# Patient Record
Sex: Male | Born: 1960 | Race: Black or African American | Hispanic: No | Marital: Single | Smoking: Current every day smoker
Health system: Southern US, Community
[De-identification: ages and names within clinical notes are randomized; demographics above are authoritative.]

## PROBLEM LIST (undated history)

## (undated) DIAGNOSIS — M1990 Unspecified osteoarthritis, unspecified site: Secondary | ICD-10-CM

## (undated) DIAGNOSIS — E119 Type 2 diabetes mellitus without complications: Secondary | ICD-10-CM

## (undated) HISTORY — PX: COLONOSCOPY: SHX174

## (undated) HISTORY — PX: FRACTURE SURGERY: SHX138

## (undated) HISTORY — PX: JOINT REPLACEMENT: SHX530

## (undated) HISTORY — PX: TONSILLECTOMY: SUR1361

## (undated) HISTORY — PX: CLOSED REDUCTION HAND FRACTURE: SHX973

---

## 2002-01-31 ENCOUNTER — Emergency Department (HOSPITAL_COMMUNITY): Admission: EM | Admit: 2002-01-31 | Discharge: 2002-01-31 | Payer: Self-pay

## 2002-01-31 ENCOUNTER — Encounter: Payer: Self-pay | Admitting: Emergency Medicine

## 2002-03-26 ENCOUNTER — Encounter: Payer: Self-pay | Admitting: Emergency Medicine

## 2002-03-26 ENCOUNTER — Emergency Department (HOSPITAL_COMMUNITY): Admission: EM | Admit: 2002-03-26 | Discharge: 2002-03-26 | Payer: Self-pay | Admitting: Emergency Medicine

## 2002-08-21 ENCOUNTER — Emergency Department (HOSPITAL_COMMUNITY): Admission: AC | Admit: 2002-08-21 | Discharge: 2002-08-21 | Payer: Self-pay | Admitting: Emergency Medicine

## 2002-08-28 ENCOUNTER — Emergency Department (HOSPITAL_COMMUNITY): Admission: EM | Admit: 2002-08-28 | Discharge: 2002-08-28 | Payer: Self-pay | Admitting: Emergency Medicine

## 2007-11-01 ENCOUNTER — Emergency Department (HOSPITAL_COMMUNITY): Admission: EM | Admit: 2007-11-01 | Discharge: 2007-11-01 | Payer: Self-pay | Admitting: Emergency Medicine

## 2008-07-18 ENCOUNTER — Emergency Department (HOSPITAL_COMMUNITY): Admission: EM | Admit: 2008-07-18 | Discharge: 2008-07-19 | Payer: Self-pay | Admitting: Emergency Medicine

## 2008-07-26 ENCOUNTER — Emergency Department (HOSPITAL_COMMUNITY): Admission: EM | Admit: 2008-07-26 | Discharge: 2008-07-26 | Payer: Self-pay | Admitting: Emergency Medicine

## 2008-08-02 ENCOUNTER — Emergency Department (HOSPITAL_COMMUNITY): Admission: EM | Admit: 2008-08-02 | Discharge: 2008-08-02 | Payer: Self-pay | Admitting: Emergency Medicine

## 2010-08-31 ENCOUNTER — Emergency Department (HOSPITAL_COMMUNITY)
Admission: EM | Admit: 2010-08-31 | Discharge: 2010-09-01 | Disposition: A | Discharge status: Other Acute Inpatient Hospital | Payer: Medicaid Other | Source: Home / Self Care | Attending: Emergency Medicine | Admitting: Emergency Medicine

## 2010-08-31 DIAGNOSIS — R1110 Vomiting, unspecified: Secondary | ICD-10-CM | POA: Insufficient documentation

## 2010-08-31 DIAGNOSIS — F1910 Other psychoactive substance abuse, uncomplicated: Secondary | ICD-10-CM | POA: Insufficient documentation

## 2010-08-31 DIAGNOSIS — F1010 Alcohol abuse, uncomplicated: Secondary | ICD-10-CM | POA: Insufficient documentation

## 2010-08-31 DIAGNOSIS — F1410 Cocaine abuse, uncomplicated: Secondary | ICD-10-CM | POA: Insufficient documentation

## 2010-08-31 DIAGNOSIS — F1210 Cannabis abuse, uncomplicated: Secondary | ICD-10-CM | POA: Insufficient documentation

## 2010-08-31 LAB — URINALYSIS, ROUTINE W REFLEX MICROSCOPIC
Bilirubin Urine: NEGATIVE
Glucose, UA: NEGATIVE mg/dL
Leukocytes, UA: NEGATIVE
Nitrite: NEGATIVE
Protein, ur: NEGATIVE mg/dL
Specific Gravity, Urine: 1.017 (ref 1.005–1.030)
Urobilinogen, UA: 1 mg/dL (ref 0.0–1.0)
pH: 5.5 (ref 5.0–8.0)

## 2010-08-31 LAB — DIFFERENTIAL
Basophils Absolute: 0 10*3/uL (ref 0.0–0.1)
Basophils Relative: 1 % (ref 0–1)
Eosinophils Absolute: 0 10*3/uL (ref 0.0–0.7)
Eosinophils Relative: 0 % (ref 0–5)
Lymphocytes Relative: 23 % (ref 12–46)
Lymphs Abs: 1.8 10*3/uL (ref 0.7–4.0)
Monocytes Absolute: 0.6 10*3/uL (ref 0.1–1.0)
Monocytes Relative: 7 % (ref 3–12)
Neutro Abs: 5.6 10*3/uL (ref 1.7–7.7)
Neutrophils Relative %: 69 % (ref 43–77)

## 2010-08-31 LAB — BASIC METABOLIC PANEL
BUN: 16 mg/dL (ref 6–23)
Chloride: 101 mEq/L (ref 96–112)
Creatinine, Ser: 1.33 mg/dL (ref 0.50–1.35)
Glucose, Bld: 87 mg/dL (ref 70–99)
Potassium: 4 mEq/L (ref 3.5–5.1)

## 2010-08-31 LAB — URINE MICROSCOPIC-ADD ON

## 2010-08-31 LAB — CBC
HCT: 38.3 % — ABNORMAL LOW (ref 39.0–52.0)
Hemoglobin: 13.3 g/dL (ref 13.0–17.0)
MCH: 29.4 pg (ref 26.0–34.0)
MCHC: 34.7 g/dL (ref 30.0–36.0)
MCV: 84.7 fL (ref 78.0–100.0)
Platelets: 243 10*3/uL (ref 150–400)
RBC: 4.52 MIL/uL (ref 4.22–5.81)
RDW: 13.4 % (ref 11.5–15.5)
WBC: 8.1 10*3/uL (ref 4.0–10.5)

## 2010-08-31 LAB — HEPATIC FUNCTION PANEL
ALT: 24 U/L (ref 0–53)
AST: 42 U/L — ABNORMAL HIGH (ref 0–37)
Bilirubin, Direct: 0.1 mg/dL (ref 0.0–0.3)
Indirect Bilirubin: 0.1 mg/dL — ABNORMAL LOW (ref 0.3–0.9)
Total Bilirubin: 0.2 mg/dL — ABNORMAL LOW (ref 0.3–1.2)

## 2010-08-31 LAB — RAPID URINE DRUG SCREEN, HOSP PERFORMED
Amphetamines: NOT DETECTED
Barbiturates: NOT DETECTED
Benzodiazepines: NOT DETECTED
Cocaine: POSITIVE — AB
Opiates: NOT DETECTED
Tetrahydrocannabinol: NOT DETECTED

## 2010-09-01 ENCOUNTER — Inpatient Hospital Stay (HOSPITAL_COMMUNITY)
Admission: AD | Admit: 2010-09-01 | Discharge: 2010-09-05 | DRG: 897 | Disposition: A | Discharge status: Home / Self Care | Payer: Medicaid Other | Source: Ambulatory Visit | Attending: Psychiatry | Admitting: Psychiatry

## 2010-09-01 DIAGNOSIS — R1110 Vomiting, unspecified: Secondary | ICD-10-CM

## 2010-09-01 DIAGNOSIS — Z560 Unemployment, unspecified: Secondary | ICD-10-CM

## 2010-09-01 DIAGNOSIS — K219 Gastro-esophageal reflux disease without esophagitis: Secondary | ICD-10-CM

## 2010-09-01 DIAGNOSIS — F1020 Alcohol dependence, uncomplicated: Principal | ICD-10-CM

## 2010-09-01 DIAGNOSIS — F1920 Other psychoactive substance dependence, uncomplicated: Secondary | ICD-10-CM

## 2010-09-01 DIAGNOSIS — F1910 Other psychoactive substance abuse, uncomplicated: Secondary | ICD-10-CM

## 2010-09-01 DIAGNOSIS — F1420 Cocaine dependence, uncomplicated: Secondary | ICD-10-CM

## 2010-09-01 DIAGNOSIS — F1220 Cannabis dependence, uncomplicated: Secondary | ICD-10-CM

## 2010-09-04 DIAGNOSIS — F1920 Other psychoactive substance dependence, uncomplicated: Secondary | ICD-10-CM

## 2010-09-07 NOTE — Discharge Summary (Signed)
[name], [name] NO.:  [id number]  MEDICAL RECORD NO.:  [id number]  LOCATION:  0305                          FACILITY:  [facility]  PHYSICIAN:  Franchot Gallo, MD     DATE OF BIRTH:  [birth year]  DATE OF ADMISSION:  09/01/2010 DATE OF DISCHARGE:  09/05/2010                              DISCHARGE SUMMARY   Mr. Keith Vaughn is a 50 year old single black male who was admitted to [facility] on September 01, 2010, for detox from alcohol and cocaine. The patient stated that he had been abusing cocaine, alcohol and marijuana for at least the past 27 years.  The patient was admitted and initially seen by Dr. Debbora Lacrosse who is no longer with Behavioral [facility].  The patient was next seen by the on-call team on September 02, 2010, and the patient was found in bed and very sleepy.  His case manager indicated that he was interested in [facility].  No other information was given.  The patient was next seen by the on-call treatment team on September 03, 2010, and stated that he was feeling okay and was up and going to groups.  He denied any medication side effects and voiced no new complaints.  He denied suicidal or homicidal ideations.  He was continued on his current treatment plan.  The patient was next seen by the on-call treatment team on September 04, 2010, and stated that he was eating well and sleeping well.  He denied any suicidal or homicidal ideations.  The patient was next seen by this provider for the first time on September 05, 2010, after the patient was transferred to me when Dr. Judithann Sheen left Behavioral Health.  The patient reported that he was sleeping well without difficulty and reported a good appetite and denied any depression.  He stated on a scale of 1-10 his depressive symptoms were zero.  He adamantly denied any suicidal or homicidal ideations and also denied any auditory or visual hallucinations or delusional thinking.  He denied any anxiety symptoms and stated that he felt ready  for discharge. The patient also denied any symptoms of substance withdrawal.  LABORATORY STUDIES:  On August 31, 2010, the patient's SGOT was slightly elevated at 42.  The patient's urine drug screen was positive for cocaine.  There were no other significant laboratories.  DISCHARGE MEDICATIONS: 1. Multivitamin p.o. q.a.m. 2. Protonix 40 mg p.o. b.i.d. with meals.  DISCHARGE DIAGNOSES:  Axis I:  Polysubstance dependence:  alcohol, cocaine and marijuana, recent usage. Axis II:  Deferred. Axis III:  Gastroesophageal reflux disease. Axis IV:  Ongoing substance-abuse-related issues.  Limited primary support system.  Unemployment.  Financial constraints. Axis V:  Global Assessment of Functioning at time of admission approximately 34.  Global Assessment of Functioning at time of discharge approximately 70.  Highest Global Assessment of Functioning in previous year approximately 70.  FOLLOW-UP INSTRUCTIONS:  The patient is to follow up with [facility] in [place], [place], and will begin groups each morning on September 06, 2010.  The patient will also follow up with [facility] residential treatment on September 12, 2010, at 8 a.m. for an intake.    ___________________________________ Harvie Heck  Jann Milkovich, MD     RR/MEDQ  D:  09/06/2010  T:  09/07/2010  Job:  [id number]  Electronically Signed by Franchot Gallo MD on 09/07/2010 07:29:55 AM

## 2010-09-13 NOTE — H&P (Signed)
NAMESAI, Keith NO.:  [id number]  MEDICAL RECORD NO.:  [id number]  LOCATION:                                 FACILITY:  PHYSICIAN:  Franchot Gallo, MD     DATE OF BIRTH:  [birth year]  DATE OF ADMISSION: DATE OF DISCHARGE:                      PSYCHIATRIC ADMISSION ASSESSMENT   (please note that the dictated psychiatric evaluation is based on data obtained from the chart.  The patient was admitted by Dr. Debbora Lacrosse and was only after discharge was it discovered that an initial psychiatric evaluation had never been dictated.  This physician is attempting to create a psychiatric evaluation from information and data in the chart.)  CHIEF COMPLAINT:  "I need detox from cocaine and alcohol."  HISTORY OF PRESENT ILLNESS:  Mr. Crum is a 50 year old single black male who presents to [facility] requesting detox from cocaine and alcohol which he states he began using approximately 30 years ago.  The patient denied any suicidal or homicidal ideations nor did report any auditory or visual hallucinations or delusional thinking.  He also denied any prolonged manic or hypomanic symptoms.  On review of the patient's record, it appears that he first began using cocaine at the age of 51 and uses approximately  2-3 times per week.  He also states that he used marijuana starting at age 54 and his usage varies.  He also reports to drinking alcohol starting at the age of 8 and averages a fifth daily.  All of his last use of this was the day prior to discharge.  The patient was admitted for detoxification from alcohol and cocaine.  PAST PSYCHIATRIC HISTORY:  From review of the patient's records, it appears that he has been hospitalized at numerous treatment centers for substance abuse-related issues.  CURRENT MEDICATIONS:  multivitamin p.o. q.a.m.  ALLERGIES:  NKDA.  MEDICAL ILLNESSES:  GU reflux disease.  PAST OPERATIONS:  None reported.  FAMILY HISTORY:  The  patient appears to have not reported any family history of psychiatric or substance abuse-related illnesses.  SOCIAL HISTORY:  The patient is single and has one son.  He is currently living with his mother and has support from his 3 sisters.  The patient is unemployed.  He completed the 11th grade of school.  As stated above, the patient reports to abusing alcohol, cocaine and marijuana since the age of 23 years.  MENTAL STATUS EXAM:  Mental status exam at time of admission must be deferred since this provider was not present at time of admission.  DIAGNOSIS:   AXIS I:  Polysubstance dependence, ongoing usage. AXIS II:  Deferred. AXIS III:  GU reflux disease. AXIS IV:  Ongoing use of substances.  Financial constraints. AXIS V:  GAF at time of admission approximately 34.  Highest GAF in past year approximately 70.  PLAN: 1. The patient was admitted to [facility] for treatment of his     substance abuse-related illnesses. 2. The patient was started on Protonix at 40 mg p.o. b.i.d. for GE     reflux disease. 3. The patient will be monitored for dangerousness to self and/or     others as well as for substance  withdrawal. 4. The patient will participate in unit in group activities per unit     routine.    ___________________________________ Franchot Gallo, MD     RR/MEDQ  D:  09/06/2010  T:  09/07/2010  Job:  [id number]  Electronically Signed by Franchot Gallo MD on 09/13/2010 03:14:20 PM

## 2010-11-03 ENCOUNTER — Emergency Department (HOSPITAL_COMMUNITY)
Admission: EM | Admit: 2010-11-03 | Discharge: 2010-11-03 | Disposition: A | Discharge status: Home / Self Care | Payer: Medicaid Other | Attending: Emergency Medicine | Admitting: Emergency Medicine

## 2010-11-03 DIAGNOSIS — F1910 Other psychoactive substance abuse, uncomplicated: Secondary | ICD-10-CM | POA: Insufficient documentation

## 2010-11-03 DIAGNOSIS — F1010 Alcohol abuse, uncomplicated: Secondary | ICD-10-CM | POA: Insufficient documentation

## 2010-11-03 LAB — RAPID URINE DRUG SCREEN, HOSP PERFORMED
Amphetamines: NOT DETECTED
Barbiturates: NOT DETECTED
Benzodiazepines: NOT DETECTED
Cocaine: POSITIVE — AB

## 2010-11-03 LAB — DIFFERENTIAL
Basophils Absolute: 0 10*3/uL (ref 0.0–0.1)
Basophils Relative: 1 % (ref 0–1)
Eosinophils Absolute: 0.2 10*3/uL (ref 0.0–0.7)
Eosinophils Relative: 4 % (ref 0–5)
Monocytes Absolute: 0.8 10*3/uL (ref 0.1–1.0)
Monocytes Relative: 13 % — ABNORMAL HIGH (ref 3–12)

## 2010-11-03 LAB — URINALYSIS, ROUTINE W REFLEX MICROSCOPIC
Bilirubin Urine: NEGATIVE
Ketones, ur: NEGATIVE mg/dL
Nitrite: NEGATIVE
pH: 6.5 (ref 5.0–8.0)

## 2010-11-03 LAB — COMPREHENSIVE METABOLIC PANEL
ALT: 19 U/L (ref 0–53)
AST: 33 U/L (ref 0–37)
Albumin: 4 g/dL (ref 3.5–5.2)
Calcium: 9.4 mg/dL (ref 8.4–10.5)
GFR calc Af Amer: >60 mL/min (ref >60)
Glucose, Bld: 102 mg/dL — ABNORMAL HIGH (ref 70–99)
Sodium: 137 mEq/L (ref 135–145)
Total Protein: 7 g/dL (ref 6.0–8.3)

## 2010-11-03 LAB — CBC
MCH: 28.7 pg (ref 26.0–34.0)
MCHC: 33.6 g/dL (ref 30.0–36.0)
Platelets: 251 10*3/uL (ref 150–400)
RDW: 13.6 % (ref 11.5–15.5)

## 2015-10-07 ENCOUNTER — Encounter (HOSPITAL_COMMUNITY): Payer: Self-pay

## 2015-11-01 ENCOUNTER — Other Ambulatory Visit: Payer: Self-pay | Admitting: Physician Assistant

## 2015-11-04 ENCOUNTER — Other Ambulatory Visit (HOSPITAL_COMMUNITY): Payer: Self-pay | Admitting: *Deleted

## 2015-11-04 NOTE — Pre-Procedure Instructions (Signed)
Thomes CakeDewey H Burpee  11/04/2015      [facility] - [place], [place] - [address] [place] [id number] Phone: [phone] Fax: [phone]    Your procedure is scheduled on 11-16-2015  Tuesday .  [facility] at 11:00 A.M.   Call this number if you have problems the morning of surgery:  [phone]   Remember:  Do not eat food or drink liquids after midnight.   Take these medicines the morning of surgery with A SIP OF WATER none          STOP ASPIRIN,ANTIINFLAMATORIES (IBUPROFEN,ALEVE,MOTRIN,ADVIL,GOODY'S POWDERS),HERBAL SUPPLEMENTS,FISH OIL,AND VITAMINS 5-7 DAYS PRIOR TO SURGERY                How to Manage Your Diabetes Before and After Surgery  Why is it important to control my blood sugar before and after surgery? . Improving blood sugar levels before and after surgery helps healing and can limit problems. . A way of improving blood sugar control is eating a healthy diet by: o  Eating less sugar and carbohydrates o  Increasing activity/exercise o  Talking with your doctor about reaching your blood sugar goals . High blood sugars (greater than 180 mg/dL) can raise your risk of infections and slow your recovery, so you will need to focus on controlling your diabetes during the weeks before surgery. . Make sure that the doctor who takes care of your diabetes knows about your planned surgery including the date and location.  How do I manage my blood sugar before surgery? . Check your blood sugar at least 4 times a day, starting 2 days before surgery, to make sure that the level is not too high or low. o Check your blood sugar the morning of your surgery when you wake up and every 2 hours until you get to the Short Stay unit. . If your blood sugar is less than 70 mg/dL, you will need to treat for low blood sugar: o Do not take insulin. o Treat a low blood sugar (less than 70 mg/dL) with  cup  of clear juice (cranberry or apple), 4 glucose tablets, OR glucose gel. o Recheck blood sugar in 15 minutes after treatment (to make sure it is greater than 70 mg/dL). If your blood sugar is not greater than 70 mg/dL on recheck, call [phone] for further instructions. . Report your blood sugar to the short stay nurse when you get to Short Stay.  . If you are admitted to the hospital after surgery: o Your blood sugar will be checked by the staff and you will probably be given insulin after surgery (instead of oral diabetes medicines) to make sure you have good blood sugar levels. o The goal for blood sugar control after surgery is 80-180 mg/dL.              WHAT DO I DO ABOUT MY DIABETES MEDICATION?   Marland Kitchen. Do not take oral diabetes medicines (pills) the morning of surgery.     . The day of surgery, do not take other diabetes injectables, including Byetta (exenatide), Bydureon (exenatide ER), Victoza (liraglutide), or Trulicity (dulaglutide).    Reviewed and Endorsed by [facility] Patient Education Committee, August 2015   Do not wear jewelry.  Do not wear lotions, powders, or perfumes, or deoderant.  Do not shave 48 hours prior to surgery.  Men may shave face and neck.   Do not bring valuables to  the hospital.  [facility] is not responsible for any belongings or valuables.  Contacts, dentures or bridgework may not be worn into surgery.  Leave your suitcase in the car.  After surgery it may be brought to your room.  For patients admitted to the hospital, discharge time will be determined by your treatment team.  Patients discharged the day of surgery will not be allowed to drive home.    Special instructions:  See attached Sheet for instructions on CHG showers  Please read over the following fact sheets that you were given. MRSA Information and Surgical Site Infection Prevention

## 2015-11-05 ENCOUNTER — Encounter (HOSPITAL_COMMUNITY)
Admission: RE | Admit: 2015-11-05 | Discharge: 2015-11-05 | Disposition: A | Discharge status: Home / Self Care | Payer: Medicaid Other | Source: Ambulatory Visit | Attending: Orthopaedic Surgery | Admitting: Orthopaedic Surgery

## 2015-11-05 ENCOUNTER — Encounter (HOSPITAL_COMMUNITY): Payer: Self-pay

## 2015-11-05 DIAGNOSIS — M1612 Unilateral primary osteoarthritis, left hip: Secondary | ICD-10-CM | POA: Insufficient documentation

## 2015-11-05 DIAGNOSIS — Z01818 Encounter for other preprocedural examination: Secondary | ICD-10-CM | POA: Diagnosis present

## 2015-11-05 DIAGNOSIS — R001 Bradycardia, unspecified: Secondary | ICD-10-CM | POA: Diagnosis not present

## 2015-11-05 DIAGNOSIS — Z01812 Encounter for preprocedural laboratory examination: Secondary | ICD-10-CM | POA: Insufficient documentation

## 2015-11-05 HISTORY — DX: Unspecified osteoarthritis, unspecified site: M19.90

## 2015-11-05 LAB — BASIC METABOLIC PANEL
ANION GAP: 5 (ref 5–15)
BUN: 12 mg/dL (ref 6–20)
CALCIUM: 9.8 mg/dL (ref 8.9–10.3)
CO2: 28 mmol/L (ref 22–32)
CREATININE: 1.22 mg/dL (ref 0.61–1.24)
Chloride: 107 mmol/L (ref 101–111)
GFR calc Af Amer: >60 mL/min (ref >60)
GLUCOSE: 71 mg/dL (ref 65–99)
Potassium: 5 mmol/L (ref 3.5–5.1)
Sodium: 140 mmol/L (ref 135–145)

## 2015-11-05 LAB — CBC
HCT: 38.7 % — ABNORMAL LOW (ref 39.0–52.0)
HEMOGLOBIN: 12.4 g/dL — AB (ref 13.0–17.0)
MCH: 27.5 pg (ref 26.0–34.0)
MCHC: 32 g/dL (ref 30.0–36.0)
MCV: 85.8 fL (ref 78.0–100.0)
PLATELETS: 221 10*3/uL (ref 150–400)
RBC: 4.51 MIL/uL (ref 4.22–5.81)
RDW: 13.3 % (ref 11.5–15.5)
WBC: 7.1 10*3/uL (ref 4.0–10.5)

## 2015-11-05 LAB — SURGICAL PCR SCREEN
MRSA, PCR: NEGATIVE
Staphylococcus aureus: NEGATIVE

## 2015-11-05 LAB — GLUCOSE, CAPILLARY: GLUCOSE-CAPILLARY: 109 mg/dL — AB (ref 65–99)

## 2015-11-06 LAB — HEMOGLOBIN A1C
HEMOGLOBIN A1C: 5.7 % — AB (ref 4.8–5.6)
MEAN PLASMA GLUCOSE: 117 mg/dL

## 2015-11-15 MED ORDER — CEFAZOLIN SODIUM-DEXTROSE 2-4 GM/100ML-% IV SOLN
2.0000 g | INTRAVENOUS | Status: AC
  Administered 2015-11-16: 2 g via INTRAVENOUS
  Filled 2015-11-15: qty 100

## 2015-11-15 MED ORDER — TRANEXAMIC ACID 1000 MG/10ML IV SOLN
1000.0000 mg | INTRAVENOUS | Status: AC
  Administered 2015-11-16: 1000 mg via INTRAVENOUS
  Filled 2015-11-15: qty 10

## 2015-11-16 ENCOUNTER — Inpatient Hospital Stay (HOSPITAL_COMMUNITY): Payer: Medicaid Other | Admitting: Certified Registered"

## 2015-11-16 ENCOUNTER — Encounter (HOSPITAL_COMMUNITY)
Admission: RE | Disposition: A | Discharge status: Home Health | Payer: Self-pay | Source: Ambulatory Visit | Attending: Orthopaedic Surgery

## 2015-11-16 ENCOUNTER — Encounter (HOSPITAL_COMMUNITY): Payer: Self-pay | Admitting: Surgery

## 2015-11-16 ENCOUNTER — Inpatient Hospital Stay (HOSPITAL_COMMUNITY): Payer: Medicaid Other

## 2015-11-16 ENCOUNTER — Inpatient Hospital Stay (HOSPITAL_COMMUNITY)
Admission: RE | Admit: 2015-11-16 | Discharge: 2015-11-19 | DRG: 470 | Disposition: A | Discharge status: Home Health | Payer: Medicaid Other | Source: Ambulatory Visit | Attending: Orthopaedic Surgery | Admitting: Orthopaedic Surgery

## 2015-11-16 DIAGNOSIS — E119 Type 2 diabetes mellitus without complications: Secondary | ICD-10-CM | POA: Diagnosis present

## 2015-11-16 DIAGNOSIS — Z791 Long term (current) use of non-steroidal anti-inflammatories (NSAID): Secondary | ICD-10-CM | POA: Diagnosis not present

## 2015-11-16 DIAGNOSIS — Z96642 Presence of left artificial hip joint: Secondary | ICD-10-CM

## 2015-11-16 DIAGNOSIS — M1612 Unilateral primary osteoarthritis, left hip: Principal | ICD-10-CM | POA: Diagnosis present

## 2015-11-16 DIAGNOSIS — Z794 Long term (current) use of insulin: Secondary | ICD-10-CM

## 2015-11-16 DIAGNOSIS — M25552 Pain in left hip: Secondary | ICD-10-CM | POA: Diagnosis present

## 2015-11-16 DIAGNOSIS — F17210 Nicotine dependence, cigarettes, uncomplicated: Secondary | ICD-10-CM | POA: Diagnosis present

## 2015-11-16 DIAGNOSIS — Z419 Encounter for procedure for purposes other than remedying health state, unspecified: Secondary | ICD-10-CM

## 2015-11-16 HISTORY — PX: TOTAL HIP ARTHROPLASTY: SHX124

## 2015-11-16 LAB — GLUCOSE, CAPILLARY
GLUCOSE-CAPILLARY: 86 mg/dL (ref 65–99)
GLUCOSE-CAPILLARY: 106 mg/dL — AB (ref 65–99)
GLUCOSE-CAPILLARY: 86 mg/dL (ref 65–99)
Glucose-Capillary: 106 mg/dL — ABNORMAL HIGH (ref 65–99)

## 2015-11-16 SURGERY — ARTHROPLASTY, HIP, TOTAL, ANTERIOR APPROACH
Anesthesia: General | Site: Hip | Laterality: Left

## 2015-11-16 MED ORDER — ONDANSETRON HCL 4 MG/2ML IJ SOLN
INTRAMUSCULAR | Status: DC | PRN
  Administered 2015-11-16: 4 mg via INTRAVENOUS

## 2015-11-16 MED ORDER — CEFAZOLIN IN D5W 1 GM/50ML IV SOLN
1.0000 g | Freq: Four times a day (QID) | INTRAVENOUS | Status: AC
  Administered 2015-11-16 – 2015-11-17 (×2): 1 g via INTRAVENOUS
  Filled 2015-11-16 (×2): qty 50

## 2015-11-16 MED ORDER — FENTANYL CITRATE (PF) 100 MCG/2ML IJ SOLN
INTRAMUSCULAR | Status: AC
  Filled 2015-11-16: qty 2

## 2015-11-16 MED ORDER — ROCURONIUM 10MG/ML (10ML) SYRINGE FOR MEDFUSION PUMP - OPTIME
INTRAVENOUS | Status: DC | PRN
  Administered 2015-11-16: 20 mg via INTRAVENOUS
  Administered 2015-11-16: 40 mg via INTRAVENOUS

## 2015-11-16 MED ORDER — MIDAZOLAM HCL 2 MG/2ML IJ SOLN
INTRAMUSCULAR | Status: AC
  Filled 2015-11-16: qty 2

## 2015-11-16 MED ORDER — HYDROMORPHONE HCL 1 MG/ML IJ SOLN
1.0000 mg | INTRAMUSCULAR | Status: DC | PRN
  Administered 2015-11-17 – 2015-11-18 (×11): 1 mg via INTRAVENOUS
  Filled 2015-11-16 (×15): qty 1

## 2015-11-16 MED ORDER — INSULIN ASPART 100 UNIT/ML ~~LOC~~ SOLN: 0.0000 [IU] | Freq: Three times a day (TID) | SUBCUTANEOUS | Status: DC

## 2015-11-16 MED ORDER — METFORMIN HCL 500 MG PO TABS
500.0000 mg | ORAL_TABLET | Freq: Two times a day (BID) | ORAL | Status: DC
Start: 2015-11-17 — End: 2015-11-19
  Administered 2015-11-17 – 2015-11-19 (×5): 500 mg via ORAL
  Filled 2015-11-16 (×5): qty 1

## 2015-11-16 MED ORDER — ACETAMINOPHEN 10 MG/ML IV SOLN
INTRAVENOUS | Status: DC | PRN
  Administered 2015-11-16: 1000 mg via INTRAVENOUS

## 2015-11-16 MED ORDER — KETOROLAC TROMETHAMINE 15 MG/ML IJ SOLN
INTRAMUSCULAR | Status: AC
  Filled 2015-11-16: qty 1

## 2015-11-16 MED ORDER — CHLORHEXIDINE GLUCONATE 4 % EX LIQD: 60.0000 mL | Freq: Once | CUTANEOUS | Status: DC

## 2015-11-16 MED ORDER — HYDROMORPHONE HCL 1 MG/ML IJ SOLN
INTRAMUSCULAR | Status: DC | PRN
  Administered 2015-11-16: 1 mg via INTRAVENOUS

## 2015-11-16 MED ORDER — ONDANSETRON HCL 4 MG/2ML IJ SOLN: 4.0000 mg | Freq: Four times a day (QID) | INTRAMUSCULAR | Status: DC | PRN

## 2015-11-16 MED ORDER — LIDOCAINE 2% (20 MG/ML) 5 ML SYRINGE
INTRAMUSCULAR | Status: AC
  Filled 2015-11-16: qty 5

## 2015-11-16 MED ORDER — DEXTROSE 5 % IV SOLN
500.0000 mg | Freq: Four times a day (QID) | INTRAVENOUS | Status: DC | PRN
  Filled 2015-11-16: qty 5

## 2015-11-16 MED ORDER — PROPOFOL 10 MG/ML IV BOLUS
INTRAVENOUS | Status: AC
  Filled 2015-11-16: qty 20

## 2015-11-16 MED ORDER — GLYCOPYRROLATE 0.2 MG/ML IV SOSY
PREFILLED_SYRINGE | INTRAVENOUS | Status: AC
  Filled 2015-11-16: qty 3

## 2015-11-16 MED ORDER — PHENYLEPHRINE 40 MCG/ML (10ML) SYRINGE FOR IV PUSH (FOR BLOOD PRESSURE SUPPORT)
PREFILLED_SYRINGE | INTRAVENOUS | Status: AC
  Filled 2015-11-16: qty 10

## 2015-11-16 MED ORDER — ZOLPIDEM TARTRATE 5 MG PO TABS
5.0000 mg | ORAL_TABLET | Freq: Every evening | ORAL | Status: DC | PRN
Start: 2015-11-16 — End: 2015-11-19
  Administered 2015-11-16 – 2015-11-18 (×3): 5 mg via ORAL
  Filled 2015-11-16 (×3): qty 1

## 2015-11-16 MED ORDER — GLYCOPYRROLATE 0.2 MG/ML IJ SOLN
INTRAMUSCULAR | Status: DC | PRN
  Administered 2015-11-16: 0.6 mg via INTRAVENOUS

## 2015-11-16 MED ORDER — PROMETHAZINE HCL 25 MG/ML IJ SOLN: 6.2500 mg | INTRAMUSCULAR | Status: DC | PRN

## 2015-11-16 MED ORDER — SODIUM CHLORIDE 0.9 % IR SOLN
Status: DC | PRN
  Administered 2015-11-16: 3000 mL

## 2015-11-16 MED ORDER — METOCLOPRAMIDE HCL 5 MG PO TABS: 5.0000 mg | ORAL_TABLET | Freq: Three times a day (TID) | ORAL | Status: DC | PRN

## 2015-11-16 MED ORDER — KETOROLAC TROMETHAMINE 15 MG/ML IJ SOLN
7.5000 mg | Freq: Four times a day (QID) | INTRAMUSCULAR | Status: AC
  Administered 2015-11-16 – 2015-11-17 (×4): 7.5 mg via INTRAVENOUS
  Filled 2015-11-16 (×3): qty 1

## 2015-11-16 MED ORDER — LACTATED RINGERS IV SOLN
INTRAVENOUS | Status: DC
  Administered 2015-11-16 (×3): via INTRAVENOUS

## 2015-11-16 MED ORDER — PHENYLEPHRINE HCL 10 MG/ML IJ SOLN
INTRAMUSCULAR | Status: DC | PRN
  Administered 2015-11-16: 80 ug via INTRAVENOUS

## 2015-11-16 MED ORDER — NALOXONE HCL 0.4 MG/ML IJ SOLN
INTRAMUSCULAR | Status: AC
  Filled 2015-11-16: qty 1

## 2015-11-16 MED ORDER — SODIUM CHLORIDE 0.9 % IV SOLN: INTRAVENOUS | Status: DC

## 2015-11-16 MED ORDER — MENTHOL 3 MG MT LOZG: 1.0000 | LOZENGE | OROMUCOSAL | Status: DC | PRN

## 2015-11-16 MED ORDER — HYDROMORPHONE HCL 1 MG/ML IJ SOLN
0.2500 mg | INTRAMUSCULAR | Status: DC | PRN
  Administered 2015-11-16: 0.5 mg via INTRAVENOUS

## 2015-11-16 MED ORDER — NEOSTIGMINE METHYLSULFATE 5 MG/5ML IV SOSY
PREFILLED_SYRINGE | INTRAVENOUS | Status: AC
  Filled 2015-11-16: qty 5

## 2015-11-16 MED ORDER — MIDAZOLAM HCL 5 MG/5ML IJ SOLN
INTRAMUSCULAR | Status: DC | PRN
  Administered 2015-11-16: 2 mg via INTRAVENOUS

## 2015-11-16 MED ORDER — ONDANSETRON HCL 4 MG PO TABS: 4.0000 mg | ORAL_TABLET | Freq: Four times a day (QID) | ORAL | Status: DC | PRN

## 2015-11-16 MED ORDER — ACETAMINOPHEN 325 MG PO TABS
650.0000 mg | ORAL_TABLET | Freq: Four times a day (QID) | ORAL | Status: DC | PRN
  Administered 2015-11-17 – 2015-11-19 (×5): 650 mg via ORAL
  Filled 2015-11-16 (×5): qty 2

## 2015-11-16 MED ORDER — METHOCARBAMOL 500 MG PO TABS
500.0000 mg | ORAL_TABLET | Freq: Four times a day (QID) | ORAL | Status: DC | PRN
  Administered 2015-11-16 – 2015-11-19 (×8): 500 mg via ORAL
  Filled 2015-11-16 (×9): qty 1

## 2015-11-16 MED ORDER — PROPOFOL 10 MG/ML IV BOLUS
INTRAVENOUS | Status: DC | PRN
  Administered 2015-11-16: 200 mg via INTRAVENOUS

## 2015-11-16 MED ORDER — ACETAMINOPHEN 650 MG RE SUPP: 650.0000 mg | Freq: Four times a day (QID) | RECTAL | Status: DC | PRN

## 2015-11-16 MED ORDER — DEXMEDETOMIDINE HCL 200 MCG/2ML IV SOLN
INTRAVENOUS | Status: DC | PRN
  Administered 2015-11-16 (×2): 20 ug via INTRAVENOUS

## 2015-11-16 MED ORDER — FENTANYL CITRATE (PF) 100 MCG/2ML IJ SOLN
INTRAMUSCULAR | Status: DC | PRN
  Administered 2015-11-16 (×5): 100 ug via INTRAVENOUS

## 2015-11-16 MED ORDER — OXYCODONE HCL 5 MG PO TABS
5.0000 mg | ORAL_TABLET | ORAL | Status: DC | PRN
  Administered 2015-11-16 – 2015-11-19 (×17): 10 mg via ORAL
  Filled 2015-11-16 (×2): qty 2
  Filled 2015-11-16: qty 1
  Filled 2015-11-16 (×9): qty 2
  Filled 2015-11-16: qty 1
  Filled 2015-11-16 (×6): qty 2

## 2015-11-16 MED ORDER — 0.9 % SODIUM CHLORIDE (POUR BTL) OPTIME
TOPICAL | Status: DC | PRN
  Administered 2015-11-16: 1000 mL

## 2015-11-16 MED ORDER — DIPHENHYDRAMINE HCL 12.5 MG/5ML PO ELIX: 12.5000 mg | ORAL_SOLUTION | ORAL | Status: DC | PRN

## 2015-11-16 MED ORDER — NEOSTIGMINE METHYLSULFATE 10 MG/10ML IV SOLN
INTRAVENOUS | Status: DC | PRN
  Administered 2015-11-16: 3 mg via INTRAVENOUS

## 2015-11-16 MED ORDER — METOCLOPRAMIDE HCL 5 MG/ML IJ SOLN: 5.0000 mg | Freq: Three times a day (TID) | INTRAMUSCULAR | Status: DC | PRN

## 2015-11-16 MED ORDER — ROCURONIUM BROMIDE 10 MG/ML (PF) SYRINGE
PREFILLED_SYRINGE | INTRAVENOUS | Status: AC
  Filled 2015-11-16: qty 10

## 2015-11-16 MED ORDER — ONDANSETRON HCL 4 MG/2ML IJ SOLN
INTRAMUSCULAR | Status: AC
  Filled 2015-11-16: qty 2

## 2015-11-16 MED ORDER — ACETAMINOPHEN 10 MG/ML IV SOLN
INTRAVENOUS | Status: AC
  Filled 2015-11-16: qty 100

## 2015-11-16 MED ORDER — HYDROMORPHONE HCL 1 MG/ML IJ SOLN
INTRAMUSCULAR | Status: AC
  Filled 2015-11-16: qty 1

## 2015-11-16 MED ORDER — DOCUSATE SODIUM 100 MG PO CAPS
100.0000 mg | ORAL_CAPSULE | Freq: Two times a day (BID) | ORAL | Status: DC
  Administered 2015-11-16 – 2015-11-18 (×4): 100 mg via ORAL
  Filled 2015-11-16 (×5): qty 1

## 2015-11-16 MED ORDER — ALUM & MAG HYDROXIDE-SIMETH 200-200-20 MG/5ML PO SUSP: 30.0000 mL | ORAL | Status: DC | PRN

## 2015-11-16 MED ORDER — LIDOCAINE HCL (CARDIAC) 20 MG/ML IV SOLN
INTRAVENOUS | Status: DC | PRN
  Administered 2015-11-16: 100 mg via INTRAVENOUS

## 2015-11-16 MED ORDER — HYDROMORPHONE HCL 1 MG/ML IJ SOLN
INTRAMUSCULAR | Status: AC
  Administered 2015-11-16: 0.5 mg via INTRAVENOUS
  Filled 2015-11-16: qty 1

## 2015-11-16 MED ORDER — ASPIRIN EC 325 MG PO TBEC
325.0000 mg | DELAYED_RELEASE_TABLET | Freq: Two times a day (BID) | ORAL | Status: DC
  Administered 2015-11-16 – 2015-11-19 (×6): 325 mg via ORAL
  Filled 2015-11-16 (×6): qty 1

## 2015-11-16 MED ORDER — PHENOL 1.4 % MT LIQD: 1.0000 | OROMUCOSAL | Status: DC | PRN

## 2015-11-16 SURGICAL SUPPLY — 50 items
APL SKNCLS STERI-STRIP NONHPOA (GAUZE/BANDAGES/DRESSINGS) ×1
BENZOIN TINCTURE PRP APPL 2/3 (GAUZE/BANDAGES/DRESSINGS) ×2 IMPLANT
BLADE SAW SGTL 18X1.27X75 (BLADE) ×2 IMPLANT
BLADE SURG ROTATE 9660 (MISCELLANEOUS) IMPLANT
CAPT HIP TOTAL 2 ×1 IMPLANT
CELLS DAT CNTRL 66122 CELL SVR (MISCELLANEOUS) ×1 IMPLANT
COVER SURGICAL LIGHT HANDLE (MISCELLANEOUS) ×2 IMPLANT
DRAPE C-ARM 42X72 X-RAY (DRAPES) ×2 IMPLANT
DRAPE STERI IOBAN 125X83 (DRAPES) ×2 IMPLANT
DRAPE U-SHAPE 47X51 STRL (DRAPES) ×6 IMPLANT
DRSG AQUACEL AG ADV 3.5X10 (GAUZE/BANDAGES/DRESSINGS) ×2 IMPLANT
DURAPREP 26ML APPLICATOR (WOUND CARE) ×2 IMPLANT
ELECT BLADE 4.0 EZ CLEAN MEGAD (MISCELLANEOUS) ×2
ELECT BLADE 6.5 EXT (BLADE) IMPLANT
ELECT REM PT RETURN 9FT ADLT (ELECTROSURGICAL) ×2
ELECTRODE BLDE 4.0 EZ CLN MEGD (MISCELLANEOUS) ×1 IMPLANT
ELECTRODE REM PT RTRN 9FT ADLT (ELECTROSURGICAL) ×1 IMPLANT
FACESHIELD WRAPAROUND (MASK) ×4 IMPLANT
FACESHIELD WRAPAROUND OR TEAM (MASK) ×2 IMPLANT
GLOVE BIOGEL PI IND STRL 8 (GLOVE) ×2 IMPLANT
GLOVE BIOGEL PI INDICATOR 8 (GLOVE) ×2
GLOVE ECLIPSE 8.0 STRL XLNG CF (GLOVE) ×2 IMPLANT
GLOVE ORTHO TXT STRL SZ7.5 (GLOVE) ×4 IMPLANT
GOWN STRL REUS W/ TWL LRG LVL3 (GOWN DISPOSABLE) ×2 IMPLANT
GOWN STRL REUS W/ TWL XL LVL3 (GOWN DISPOSABLE) ×2 IMPLANT
GOWN STRL REUS W/TWL LRG LVL3 (GOWN DISPOSABLE) ×4
GOWN STRL REUS W/TWL XL LVL3 (GOWN DISPOSABLE) ×4
HANDPIECE INTERPULSE COAX TIP (DISPOSABLE) ×2
KIT BASIN OR (CUSTOM PROCEDURE TRAY) ×2 IMPLANT
KIT ROOM TURNOVER OR (KITS) ×2 IMPLANT
MANIFOLD NEPTUNE II (INSTRUMENTS) ×2 IMPLANT
NS IRRIG 1000ML POUR BTL (IV SOLUTION) ×2 IMPLANT
PACK TOTAL JOINT (CUSTOM PROCEDURE TRAY) ×2 IMPLANT
PAD ARMBOARD 7.5X6 YLW CONV (MISCELLANEOUS) ×2 IMPLANT
RTRCTR WOUND ALEXIS 18CM MED (MISCELLANEOUS) ×2
SET HNDPC FAN SPRY TIP SCT (DISPOSABLE) ×1 IMPLANT
STAPLER VISISTAT 35W (STAPLE) IMPLANT
STRIP CLOSURE SKIN 1/2X4 (GAUZE/BANDAGES/DRESSINGS) ×4 IMPLANT
SUT ETHIBOND NAB CT1 #1 30IN (SUTURE) ×2 IMPLANT
SUT MNCRL AB 4-0 PS2 18 (SUTURE) IMPLANT
SUT VIC AB 0 CT1 27 (SUTURE) ×2
SUT VIC AB 0 CT1 27XBRD ANBCTR (SUTURE) ×1 IMPLANT
SUT VIC AB 1 CT1 27 (SUTURE) ×2
SUT VIC AB 1 CT1 27XBRD ANBCTR (SUTURE) ×1 IMPLANT
SUT VIC AB 2-0 CT1 27 (SUTURE) ×2
SUT VIC AB 2-0 CT1 TAPERPNT 27 (SUTURE) ×1 IMPLANT
TOWEL OR 17X24 6PK STRL BLUE (TOWEL DISPOSABLE) ×2 IMPLANT
TOWEL OR 17X26 10 PK STRL BLUE (TOWEL DISPOSABLE) ×2 IMPLANT
TRAY FOLEY CATH 16FRSI W/METER (SET/KITS/TRAYS/PACK) IMPLANT
WATER STERILE IRR 1000ML POUR (IV SOLUTION) ×4 IMPLANT

## 2015-11-16 NOTE — Anesthesia Procedure Notes (Addendum)
Procedure Name: Intubation Date/Time: 11/16/2015 12:42 PM Performed by: Bonita QuinGUIDETTI, Kalim Kissel S Pre-anesthesia Checklist: Patient identified, Emergency Drugs available, Suction available, Timeout performed and Patient being monitored Patient Re-evaluated:Patient Re-evaluated prior to inductionOxygen Delivery Method: Circle system utilized Preoxygenation: Pre-oxygenation with 100% oxygen Intubation Type: IV induction Ventilation: Mask ventilation without difficulty Laryngoscope Size: Mac and 4 Grade View: Grade III Tube type: Oral Tube size: 7.5 mm Number of attempts: 2 Placement Confirmation: breath sounds checked- equal and bilateral,  CO2 detector,  positive ETCO2 and ETT inserted through vocal cords under direct vision Secured at: 24 cm Tube secured with: Tape Dental Injury: Teeth and Oropharynx as per pre-operative assessment  Difficulty Due To: Difficulty was unanticipated and Difficult Airway- due to anterior larynx Future Recommendations: Recommend- induction with short-acting agent, and alternative techniques readily available Comments: Patient with anterior larynx, grade 3 view. Easy ventilation, recommend video laryngoscopy for future intubations.

## 2015-11-16 NOTE — Brief Op Note (Signed)
11/16/2015  2:04 PM  PATIENT:  Keith Vaughn  55 y.o. male  PRE-OPERATIVE DIAGNOSIS:  severe osteoarthritis left hip  POST-OPERATIVE DIAGNOSIS:  severe osteoarthritis left hip  PROCEDURE:  Procedure(s): LEFT TOTAL HIP ARTHROPLASTY ANTERIOR APPROACH (Left)  SURGEON:  Surgeon(s) and Role:    * Kathryne Hitchhristopher Y Taten Merrow, MD - Primary  PHYSICIAN ASSISTANT: Rexene EdisonGil Clark, PA-C  ANESTHESIA:   general  EBL:  Total I/O In: 1000 [I.V.:1000] Out: 150 [Blood:150]  COUNTS:  YES  TOURNIQUET:  * No tourniquets in log *  DICTATION: .Other Dictation: Dictation Number [id number]  PLAN OF CARE: Admit to inpatient   PATIENT DISPOSITION:  PACU - hemodynamically stable.   Delay start of Pharmacological VTE agent (>24hrs) due to surgical blood loss or risk of bleeding: no

## 2015-11-16 NOTE — Transfer of Care (Signed)
Immediate Anesthesia Transfer of Care Note  Patient: Keith Vaughn  Procedure(s) Performed: Procedure(s): LEFT TOTAL HIP ARTHROPLASTY ANTERIOR APPROACH (Left)  Patient Location: PACU  Anesthesia Type:General  Level of Consciousness: awake and alert   Airway & Oxygen Therapy: Patient Spontanous Breathing and Patient connected to face mask oxygen  Post-op Assessment: Report given to RN and Post -op Vital signs reviewed and stable  Post vital signs: Reviewed and stable  Last Vitals:  Vitals:   11/16/15 1006 11/16/15 1442  BP: (!) 174/94   Pulse: (!) 56   Resp: 18   Temp: 36.7 C (P) 36.4 C    Last Pain:  Vitals:   11/16/15 1006  TempSrc: Oral         Complications: No apparent anesthesia complications

## 2015-11-16 NOTE — Op Note (Signed)
Keith, Vaughn NO.:  [id number]  MEDICAL RECORD NO.:  [id number]  LOCATION:  [facility]                         FACILITY:  [facility]  PHYSICIAN:  Keith Vaughn, M.D.DATE OF BIRTH:  1960-10-17  DATE OF PROCEDURE:  11/16/2015 DATE OF DISCHARGE:                              OPERATIVE REPORT   PREOPERATIVE DIAGNOSES:  Primary osteoarthritis and degenerative joint disease, left hip.  POSTOPERATIVE DIAGNOSES:  Primary osteoarthritis and degenerative joint disease, left hip.  PROCEDURE:  Left total hip arthroplasty through direct anterior approach.  IMPLANTS:  DePuy Sector Gription acetabular component size 52, size 36+ 0 neutral polyethylene liner, size 12 Corail femoral component with varus offset, size 36+ 5 ceramic hip ball.  SURGEON:  Keith Vaughn, M.D.  ASSISTANT:  Keith Canal, PA-C.  ANESTHESIA:  General.  ANTIBIOTICS:  2 g of IV Ancef.  BLOOD LOSS:  150 mL.  COMPLICATIONS:  None.  INDICATIONS:  Mr. Keith Vaughn is a 55 year old gentleman who is avid weight lifter.  He has developed worsening left hip pain for several years now. We saw him in the office and had x-rays that showed complete loss of the superior lateral aspect of his left hip with bone-on-bone wear, obvious femoral acetabular impingement, severe sclerotic changes.  His pain is daily, and detrimentally affected his activities of daily living, his mobility, and his quality of life to the point that he did wish to proceed with a total hip arthroplasty.  This was recommended to him as well.  We talked to him about the risk of acute blood loss anemia, nerve and vessel injury, fracture, infection, dislocation, and DVT.  He understands our goals are decreased pain, improved mobility, and overall improved quality of life.  PROCEDURE DESCRIPTION:  After informed consent was obtained, appropriate left hip was marked.  He was brought to the operating room.  General anesthesia was  obtained while he was on the stretcher.  Traction boots were placed on both his feet.  Next, he was placed supine on the HANA fracture table with the perineal post in place and both legs in inline skeletal traction devices, but no traction applied.  His left operative hip was prepped and draped with DuraPrep and sterile drapes.  Time-out was called.  He was identified as correct patient, correct left hip.  We then made an incision just inferior and posterior to the anterior superior iliac spine and carried this obliquely down the leg.  We dissected down the tensor fascia lata muscle and tensor fascia was divided longitudinally, so we could proceed with direct anterior approach to the hip.  We identified and cauterized the circumflex vessels and then identified the hip capsule, opened up the hip capsule in L-type format finding a moderate joint effusion.  We placed Cobra retractors within joint capsule and made our femoral neck cut proximal to the lesser trochanter and completed this with an osteotome.  We placed a corkscrew guide in the femoral head and removed the femoral head its entirety and found it to be completely devoid of cartilage.  We then placed a bent Hohmann over the medial acetabular rim and removed remnants of acetabular labrum.  We then began  reaming under direct visualization from a size 42 reamer in 2 mm increments up to a size 52 with all reamers under direct visualization and the last reamer also under direct fluoroscopy, so we could obtain our depth of reaming, our inclination, and anteversion.  Once we were pleased with this, we placed the real DePuy Sector Gription acetabular component size 52 and a 36+ 0 neutral polyethylene liner for that size acetabular component. Attention was then turned to the femur.  With the leg externally rotated to 120 degrees, extended and adducted, we were able to place a Mueller retractor medially and Hohmann retractor behind the greater  trochanter. We released the lateral joint capsule and used a box cutting osteotome to enter the femoral Vaughn and a rongeur to lateralize.  We then began broaching from a size 8 broach using the Corail broaching system up to a size 12.  Based on his anatomy, we trialed a varus offset femoral neck and 36 + 1.5 hip ball.  We brought the leg back over and up with traction and internal rotation reducing the pelvis and we were pleased with stability, but we felt like we needed just a little bit more leg length and offset.  We dislocated the hip and removed the trial components.  We were able to place the real Corail femoral component size 12 with varus offset and the real 36+ 5 ceramic hip ball, reduced this in the acetabulum.  We were pleased with leg length, offset, and range of motion.  This was comparable to his preoperative x-rays and preoperative leg length.  We then irrigated the soft tissues with normal saline solution using pulsatile lavage.  We were able to close the joint capsule with interrupted #1 Ethibond suture followed by running #1 Vicryl in the tensor fascia, 0-Vicryl deep tissue, 2-0 Vicryl in the subcutaneous tissue, 4-0 Monocryl subcuticular stitch and Steri-Strips on the skin.  An Aquacel dressing was applied.  He was taken off the HANA table, awakened, extubated, and taken to the recovery room in stable condition.  All final counts were correct.  There were no complications noted.  Of note, Keith CanalGilbert Clark, PA-C assisted in the entire case.  His assistance was crucial for facilitating all aspects of this case.     Keith Pandahristopher Y. Magnus Vaughn, M.D.     CYB/MEDQ  D:  11/16/2015  T:  11/16/2015  Job:  [id number]

## 2015-11-16 NOTE — Anesthesia Preprocedure Evaluation (Signed)
Anesthesia Evaluation  Patient identified by MRN, date of birth, ID band Patient awake    Reviewed: Allergy & Precautions, NPO status , Patient's Chart, lab work & pertinent test results  Airway Mallampati: II  TM Distance: >3 FB Neck ROM: Full    Dental  (+) Poor Dentition,    Pulmonary neg pulmonary ROS,    Pulmonary exam normal        Cardiovascular Normal cardiovascular exam     Neuro/Psych negative neurological ROS  negative psych ROS   GI/Hepatic negative GI ROS, Neg liver ROS,   Endo/Other  diabetes, Type 2  Renal/GU CRFRenal disease  negative genitourinary   Musculoskeletal  (+) Arthritis ,   Abdominal   Peds  Hematology   Anesthesia Other Findings   Reproductive/Obstetrics                             Anesthesia Physical Anesthesia Plan  ASA: III  Anesthesia Plan: General   Post-op Pain Management:    Induction: Intravenous  Airway Management Planned: Oral ETT  Additional Equipment:   Intra-op Plan:   Post-operative Plan: Extubation in OR  Informed Consent: I have reviewed the patients History and Physical, chart, labs and discussed the procedure including the risks, benefits and alternatives for the proposed anesthesia with the patient or authorized representative who has indicated his/her understanding and acceptance.   Dental advisory given  Plan Discussed with: CRNA and Surgeon  Anesthesia Plan Comments:         Anesthesia Quick Evaluation

## 2015-11-16 NOTE — H&P (Signed)
TOTAL HIP ADMISSION H&P  Patient is admitted for left total hip arthroplasty.  Subjective:  Chief Complaint: left hip pain  HPI: Keith Vaughn, 55 y.o. male, has a history of pain and functional disability in the left hip(s) due to arthritis and patient has failed non-surgical conservative treatments for greater than 12 weeks to include NSAID's and/or analgesics, flexibility and strengthening excercises and activity modification.  Onset of symptoms was gradual starting 2 years ago with gradually worsening course since that time.The patient noted no past surgery on the left hip(s).  Patient currently rates pain in the left hip at 10 out of 10 with activity. Patient has night pain, worsening of pain with activity and weight bearing, pain that interfers with activities of daily living and pain with passive range of motion. Patient has evidence of subchondral sclerosis, periarticular osteophytes and joint space narrowing by imaging studies. This condition presents safety issues increasing the risk of falls.  There is no current active infection.  Patient Active Problem List   Diagnosis Date Noted  . Osteoarthritis of left hip 11/16/2015   Past Medical History:  Diagnosis Date  . Arthritis   . Diabetes mellitus without complication [facility])     Past Surgical History:  Procedure Laterality Date  . HAND SURGERY Right    bolts and screws    Prescriptions Prior to Admission  Medication Sig Dispense Refill Last Dose  . meloxicam (MOBIC) 7.5 MG tablet Take 7.5 mg by mouth 2 (two) times daily.  0 11/15/2015 at Unknown time  . metFORMIN (GLUCOPHAGE) 1000 MG tablet Take 500 mg by mouth 2 (two) times daily.   0 11/15/2015 at Unknown time   Allergies  Allergen Reactions  . No Known Allergies     Social History  Substance Use Topics  . Smoking status: Current Every Day Smoker    Types: Cigarettes  . Smokeless tobacco: Never Used     Comment: onlt 3 cigarettes  a day  . Alcohol use No     Comment:  quit 5 years ago    History reviewed. No pertinent family history.   Review of Systems  Musculoskeletal: Positive for joint pain.  All other systems reviewed and are negative.   Objective:  Physical Exam  Constitutional: He is oriented to person, place, and time. He appears well-nourished.  HENT:  Head: Normocephalic and atraumatic.  Eyes: EOM are normal. Pupils are equal, round, and reactive to light.  Neck: Normal range of motion. Neck supple.  Cardiovascular: Normal rate and regular rhythm.   Respiratory: Effort normal and breath sounds normal.  GI: Soft. Bowel sounds are normal.  Musculoskeletal:       Left hip: He exhibits decreased range of motion, decreased strength, tenderness and bony tenderness.  Neurological: He is alert and oriented to person, place, and time.  Skin: Skin is warm and dry.  Psychiatric: He has a normal mood and affect.    Vital signs in last 24 hours: Temp:  [98 F (36.7 C)] 98 F (36.7 C) (09/12 1006) Pulse Rate:  [56] 56 (09/12 1006) Resp:  [18] 18 (09/12 1006) BP: (174)/(94) 174/94 (09/12 1006) SpO2:  [100 %] 100 % (09/12 1006) Weight:  [74.9 kg (165 lb 2 oz)] 74.9 kg (165 lb 2 oz) (09/12 1006)  Labs:   Estimated body mass index is 27.48 kg/m as calculated from the following:   Height as of this encounter: 5\' 5"  (1.651 m).   Weight as of this encounter: 74.9 kg (165  lb 2 oz).   Imaging Review Plain radiographs demonstrate severe degenerative joint disease of the left hip(s). The bone quality appears to be excellent for age and reported activity level.  Assessment/Plan:  End stage arthritis, left hip(s)  The patient history, physical examination, clinical judgement of the provider and imaging studies are consistent with end stage degenerative joint disease of the left hip(s) and total hip arthroplasty is deemed medically necessary. The treatment options including medical management, injection therapy, arthroscopy and arthroplasty were  discussed at length. The risks and benefits of total hip arthroplasty were presented and reviewed. The risks due to aseptic loosening, infection, stiffness, dislocation/subluxation,  thromboembolic complications and other imponderables were discussed.  The patient acknowledged the explanation, agreed to proceed with the plan and consent was signed. Patient is being admitted for inpatient treatment for surgery, pain control, PT, OT, prophylactic antibiotics, VTE prophylaxis, progressive ambulation and ADL's and discharge planning.The patient is planning to be discharged home with home health services

## 2015-11-17 ENCOUNTER — Encounter (HOSPITAL_COMMUNITY): Payer: Self-pay | Admitting: Orthopaedic Surgery

## 2015-11-17 LAB — BASIC METABOLIC PANEL
Anion gap: 7 (ref 5–15)
BUN: 18 mg/dL (ref 6–20)
CALCIUM: 8.9 mg/dL (ref 8.9–10.3)
CO2: 27 mmol/L (ref 22–32)
CREATININE: 1.35 mg/dL — AB (ref 0.61–1.24)
Chloride: 105 mmol/L (ref 101–111)
GFR calc non Af Amer: 58 mL/min — ABNORMAL LOW (ref >60)
Glucose, Bld: 104 mg/dL — ABNORMAL HIGH (ref 65–99)
Potassium: 3.9 mmol/L (ref 3.5–5.1)
SODIUM: 139 mmol/L (ref 135–145)

## 2015-11-17 LAB — CBC
HEMATOCRIT: 35.1 % — AB (ref 39.0–52.0)
Hemoglobin: 11.9 g/dL — ABNORMAL LOW (ref 13.0–17.0)
MCH: 28.8 pg (ref 26.0–34.0)
MCHC: 33.9 g/dL (ref 30.0–36.0)
MCV: 85 fL (ref 78.0–100.0)
Platelets: 195 10*3/uL (ref 150–400)
RBC: 4.13 MIL/uL — ABNORMAL LOW (ref 4.22–5.81)
RDW: 13.1 % (ref 11.5–15.5)
WBC: 8.1 10*3/uL (ref 4.0–10.5)

## 2015-11-17 LAB — GLUCOSE, CAPILLARY
GLUCOSE-CAPILLARY: 116 mg/dL — AB (ref 65–99)
Glucose-Capillary: 99 mg/dL (ref 65–99)
Glucose-Capillary: 101 mg/dL — ABNORMAL HIGH (ref 65–99)
Glucose-Capillary: 110 mg/dL — ABNORMAL HIGH (ref 65–99)

## 2015-11-17 NOTE — Evaluation (Signed)
Occupational Therapy Evaluation Patient Details Name: Keith Vaughn MRN: [id number] DOB: [birth year] Today's Date: 11/17/2015    History of Present Illness Pt is a 55 y/o male s/p L THA (direct anterior approach). PMH including but not limited to DM.   Clinical Impression   Pt admitted with above. He demonstrates the below listed deficits and will benefit from continued OT to maximize safety and independence with BADLs/  Pt is able to perform ADLs with supervision.  He is highly impulsive with poor safety awareness, placing him at high risk for falls/injury.  Safe instruction provided, but he frequently disregards info.   Will follow for continued education.        Follow Up Recommendations  No OT follow up;Supervision - Intermittent    Equipment Recommendations  3 in 1 bedside comode    Recommendations for Other Services       Precautions / Restrictions Precautions Precautions: Fall Restrictions Weight Bearing Restrictions: Yes LLE Weight Bearing: Weight bearing as tolerated      Mobility Bed Mobility Overal bed mobility: Needs Assistance Bed Mobility: Supine to Sit;Sit to Supine     Supine to sit: Supervision Sit to supine: Supervision   General bed mobility comments: supervision due to fall risk and impulsivity   Transfers Overall transfer level: Needs assistance Equipment used: Rolling walker (2 wheeled) Transfers: Sit to/from UGI CorporationStand;Stand Pivot Transfers Sit to Stand: Supervision Stand pivot transfers: Supervision       General transfer comment: Pt requires supervision due to high risk of falls, and impulsivity     Balance Overall balance assessment: Needs assistance Sitting-balance support: Feet supported Sitting balance-Leahy Scale: Good     Standing balance support: Single extremity supported Standing balance-Leahy Scale: Fair Standing balance comment: In standing with RW, pt attempting to don underwear and pants; initially refusing assistance but  therapist was able to assist with starting L foot into leg of boxers and pants.                            ADL Overall ADL's : Needs assistance/impaired Eating/Feeding: Independent   Grooming: Wash/dry hands;Wash/dry face;Oral care;Brushing hair;Supervision/safety;Standing   Upper Body Bathing: Set up;Sitting   Lower Body Bathing: Supervison/ safety;Sit to/from stand   Upper Body Dressing : Set up;Sitting   Lower Body Dressing: Supervision/safety;Sit to/from stand   Toilet Transfer: Firefighterupervision/safety;RW Toilet Transfer Details (indicate cue type and reason): Pt is impulsive and unsafe.  He was leaning far off his BOS to reach for door to close it upon therapist's entrance.  Therapist cautioned pt on risk for falls, and he insists he is fine and will not fall.  Pt will not adhere to safety recommendations despite multiple attempts and instruction  Toileting- Clothing Manipulation and Hygiene: Supervision/safety;Sit to/from stand   Tub/ Shower Transfer: Supervision/safety;Ambulation;Rolling walker Tub/Shower Transfer Details (indicate cue type and reason): Pt initially states he will take a sit down bath.  Strongly cautioned him against that due to increased risk of infection.  He states he will go to his sister's home to shower.  Pt able to step over tub with supervision.  Would not allow therapist to touch him or place a gait belt    General ADL Comments: Pt instructed to sit to don/doff pants due to increased risk of falls.  Pt insists he is fine and will not fall because "i work out every day".  He states that "she" (would not elaborate how she is), told him  he could do squats with his weights upon returning home.  Instructed him to discuss with MD and not attempt until MD clears him      Vision     Perception     Praxis      Pertinent Vitals/Pain Pain Assessment: No/denies pain Faces Pain Scale: Hurts little more Pain Location: L hip and thigh Pain Descriptors /  Indicators: Sore;Grimacing;Guarding Pain Intervention(s): Monitored during session;Repositioned     Hand Dominance Right   Extremity/Trunk Assessment Upper Extremity Assessment Upper Extremity Assessment: Overall WFL for tasks assessed   Lower Extremity Assessment Lower Extremity Assessment: Defer to PT evaluation LLE Deficits / Details: Pt with decreased strength and ROM limitations secondary to post-op.   Cervical / Trunk Assessment Cervical / Trunk Assessment: Normal   Communication Communication Communication: No difficulties   Cognition Arousal/Alertness: Awake/alert Behavior During Therapy: WFL for tasks assessed/performed;Impulsive Overall Cognitive Status: Within Functional Limits for tasks assessed                     General Comments       Exercises       Shoulder Instructions      Home Living Family/patient expects to be discharged to:: Private residence Living Arrangements: Alone Available Help at Discharge: Family;Available PRN/intermittently Type of Home: House Home Access: Level entry     Home Layout: One level     Bathroom Shower/Tub: Tub only         Home Equipment: None   Additional Comments: Pt states he will walk to his sister's home and shower there       Prior Functioning/Environment Level of Independence: Independent        Comments: pt stated that he weight lifts in his backyard on a regular basis    OT Diagnosis: Generalized weakness   OT Problem List: Decreased strength;Decreased safety awareness;Decreased knowledge of use of DME or AE   OT Treatment/Interventions: Self-care/ADL training;DME and/or AE instruction;Therapeutic activities;Patient/family education    OT Goals(Current goals can be found in the care plan section) Acute Rehab OT Goals Patient Stated Goal: return home and start working out again OT Goal Formulation: With patient Time For Goal Achievement: 11/24/15 Potential to Achieve Goals: Good ADL  Goals Additional ADL Goal #1: Pt will perform ADLs mod I demonstrating good safety awareness   OT Frequency: Min 2X/week   Barriers to D/C:            Co-evaluation              End of Session Equipment Utilized During Treatment: Rolling walker Nurse Communication: Mobility status  Activity Tolerance: Patient tolerated treatment well Patient left: in bed;with call bell/phone within reach;with bed alarm set   Time: 1153-1208 OT Time Calculation (min): 15 min Charges:  OT General Charges $OT Visit: 1 Procedure OT Evaluation $OT Eval Low Complexity: 1 Procedure G-Codes:    Jeani Hawking M 2015/11/26, 12:52 PM

## 2015-11-17 NOTE — Progress Notes (Signed)
Subjective: 1 Day Post-Op Procedure(s) (LRB): LEFT TOTAL HIP ARTHROPLASTY ANTERIOR APPROACH (Left) Patient reports pain as moderate.    Objective: Vital signs in last 24 hours: Temp:  [97.5 F (36.4 C)-98.7 F (37.1 C)] 98.7 F (37.1 C) (09/13 0450) Pulse Rate:  [44-68] 68 (09/13 0450) Resp:  [8-20] 18 (09/13 0450) BP: (130-181)/(62-119) 130/62 (09/13 0450) SpO2:  [98 %-100 %] 99 % (09/13 0450) Weight:  [74.9 kg (165 lb 2 oz)] 74.9 kg (165 lb 2 oz) (09/12 1006)  Intake/Output from previous day: 09/12 0701 - 09/13 0700 In: 2440 [P.O.:240; I.V.:2200] Out: 1200 [Urine:1050; Blood:150] Intake/Output this shift: No intake/output data recorded.   Recent Labs  11/17/15 0459  HGB 11.9*    Recent Labs  11/17/15 0459  WBC 8.1  RBC 4.13*  HCT 35.1*  PLT 195    Recent Labs  11/17/15 0459  NA 139  K 3.9  CL 105  CO2 27  BUN 18  CREATININE 1.35*  GLUCOSE 104*  CALCIUM 8.9   No results for input(s): LABPT, INR in the last 72 hours.  Sensation intact distally Intact pulses distally Dorsiflexion/Plantar flexion intact Incision: dressing C/D/I  Assessment/Plan: 1 Day Post-Op Procedure(s) (LRB): LEFT TOTAL HIP ARTHROPLASTY ANTERIOR APPROACH (Left) Up with therapy Plan for discharge tomorrow Discharge home with home health  Kathryne HitchChristopher Y Xuan Mateus 11/17/2015, 7:59 AM

## 2015-11-17 NOTE — Evaluation (Signed)
Physical Therapy Evaluation Patient Details Name: Keith Vaughn H Merkin MRN: [id number] DOB: [birth year] Today's Date: 11/17/2015   History of Present Illness  Pt is a 55 y/o male s/p L THA (direct anterior approach). PMH including but not limited to DM.  Clinical Impression  Pt presented supine in bed, awake and willing to participate in therapy session. Pt stated that prior to admission, he was independent with all functional mobility and worked out (weight lifting) in his backyard on a regular basis. Pt was very impulsive during therapy session and frequently stated "I don't need you to hold onto me". Additionally, pt requested to don his clothing during session, initially refusing any assistance or sitting down to make the situation safer. However, therapist was finally able to encourage pt to sit EOB to don his pants. Pt would continue to benefit from skilled physical therapy services at this time while admitted and after d/c to address his below listed limitations in order to improve his overall safety and independence with functional mobility.     Follow Up Recommendations Home health PT;Supervision for mobility/OOB    Equipment Recommendations  Rolling walker with 5" wheels    Recommendations for Other Services       Precautions / Restrictions Precautions Precautions: Fall Restrictions Weight Bearing Restrictions: Yes LLE Weight Bearing: Weight bearing as tolerated      Mobility  Bed Mobility Overal bed mobility: Needs Assistance Bed Mobility: Supine to Sit     Supine to sit: Supervision;HOB elevated     General bed mobility comments: pt required increased time and use of bed rails  Transfers Overall transfer level: Needs assistance Equipment used: Rolling walker (2 wheeled) Transfers: Sit to/from Stand Sit to Stand: Supervision         General transfer comment: pt was impulsive with standing from bed prior to therapist donning gait belt    Ambulation/Gait Ambulation/Gait assistance: Min guard Ambulation Distance (Feet): 200 Feet Assistive device: Rolling walker (2 wheeled) Gait Pattern/deviations: Step-through pattern;Decreased step length - right;Decreased stance time - left;Decreased weight shift to left Gait velocity: decreased Gait velocity interpretation: Below normal speed for age/gender    Stairs            Wheelchair Mobility    Modified Rankin (Stroke Patients Only)       Balance Overall balance assessment: Needs assistance Sitting-balance support: Feet supported;No upper extremity supported Sitting balance-Leahy Scale: Good     Standing balance support: During functional activity;No upper extremity supported Standing balance-Leahy Scale: Fair Standing balance comment: In standing with RW, pt attempting to don underwear and pants; initially refusing assistance but therapist was able to assist with starting L foot into leg of boxers and pants.                             Pertinent Vitals/Pain Pain Assessment: Faces Faces Pain Scale: Hurts little more Pain Location: L hip and thigh Pain Descriptors / Indicators: Sore;Grimacing;Guarding Pain Intervention(s): Monitored during session;Repositioned    Home Living Family/patient expects to be discharged to:: Private residence Living Arrangements: Alone Available Help at Discharge: Family;Available PRN/intermittently Type of Home: House Home Access: Level entry     Home Layout: One level Home Equipment: None      Prior Function Level of Independence: Independent         Comments: pt stated that he weight lifts in his backyard on a regular basis     Hand Dominance  Extremity/Trunk Assessment   Upper Extremity Assessment: Defer to OT evaluation           Lower Extremity Assessment: LLE deficits/detail   LLE Deficits / Details: Pt with decreased strength and ROM limitations secondary to  post-op.  Cervical / Trunk Assessment: Normal  Communication   Communication: No difficulties  Cognition Arousal/Alertness: Awake/alert Behavior During Therapy: WFL for tasks assessed/performed;Impulsive Overall Cognitive Status: Within Functional Limits for tasks assessed                      General Comments      Exercises        Assessment/Plan    PT Assessment Patient needs continued PT services  PT Diagnosis Difficulty walking   PT Problem List Decreased strength;Decreased range of motion;Decreased activity tolerance;Decreased balance;Decreased mobility;Decreased coordination;Decreased knowledge of use of DME;Pain  PT Treatment Interventions DME instruction;Gait training;Therapeutic activities;Stair training;Functional mobility training;Therapeutic exercise;Balance training;Neuromuscular re-education;Patient/family education   PT Goals (Current goals can be found in the Care Plan section) Acute Rehab PT Goals Patient Stated Goal: return home and start working out again PT Goal Formulation: With patient Time For Goal Achievement: 11/24/15 Potential to Achieve Goals: Good    Frequency 7X/week   Barriers to discharge        Co-evaluation               End of Session Equipment Utilized During Treatment: Gait belt Activity Tolerance: Patient limited by pain Patient left: in chair;with call bell/phone within reach;with family/visitor present Nurse Communication: Mobility status         Time: 3710-6269 PT Time Calculation (min) (ACUTE ONLY): 35 min   Charges:   PT Evaluation $PT Eval Low Complexity: 1 Procedure PT Treatments $Gait Training: 8-22 mins   PT G CodesAlessandra Bevels Catheleen Langhorne 11/17/2015, 10:18 AM Deborah Chalk, PT, DPT [phone]

## 2015-11-17 NOTE — Progress Notes (Signed)
Pt is doing well with amubulating with walker with standby assist. He has been to the bathroom and around room with supervision from RN & NT. Now, he is saying that he doesn't need any help and that he "isn't clumsy and isn't going to fall." Pt educated on safety and the need for supervision at this point d/t pain medications, etc. Pt is very impulsive as morning progresses.   [place], Latricia HeftKorie G

## 2015-11-17 NOTE — Progress Notes (Signed)
Physical Therapy Treatment Patient Details Name: Keith Vaughn H Collantes MRN: [id number] DOB: 1960-07-30 Today's Date: 11/17/2015    History of Present Illness Pt is a 55 y/o male s/p L THA (direct anterior approach). PMH including but not limited to DM.    PT Comments    Pt presented supine in bed, awake and willing to participate in therapy session. Pt making progress towards achieving his functional goals. Pt continues to be impulsive throughout session, disregarding PT's education for safety awareness. Pt would continue to benefit from skilled physical therapy services at this time while admitted and after d/c to address his limitations in order to improve his overall safety and independence with functional mobility.   Follow Up Recommendations  Home health PT;Supervision for mobility/OOB     Equipment Recommendations  Rolling walker with 5" wheels    Recommendations for Other Services       Precautions / Restrictions Precautions Precautions: Fall Restrictions Weight Bearing Restrictions: Yes LLE Weight Bearing: Weight bearing as tolerated    Mobility  Bed Mobility Overal bed mobility: Needs Assistance Bed Mobility: Supine to Sit;Sit to Supine     Supine to sit: Supervision;HOB elevated Sit to supine: Min assist   General bed mobility comments: min A L LE movement   Transfers Overall transfer level: Needs assistance Equipment used: Rolling walker (2 wheeled) Transfers: Sit to/from Stand Sit to Stand: Supervision Stand pivot transfers: Supervision       General transfer comment: Pt requires supervision due to high risk of falls, and impulsivity   Ambulation/Gait Ambulation/Gait assistance: Min guard Ambulation Distance (Feet): 200 Feet Assistive device: Rolling walker (2 wheeled) Gait Pattern/deviations: Step-through pattern;Antalgic Gait velocity: decreased Gait velocity interpretation: Below normal speed for age/gender     Stairs            Wheelchair  Mobility    Modified Rankin (Stroke Patients Only)       Balance Overall balance assessment: Needs assistance Sitting-balance support: Feet supported;No upper extremity supported Sitting balance-Leahy Scale: Good     Standing balance support: During functional activity;No upper extremity supported Standing balance-Leahy Scale: Fair                      Cognition Arousal/Alertness: Awake/alert Behavior During Therapy: WFL for tasks assessed/performed;Impulsive Overall Cognitive Status: Within Functional Limits for tasks assessed                      Exercises Total Joint Exercises Quad Sets: AROM;Strengthening;Left;10 reps;Supine Heel Slides: AAROM;Left;10 reps;Supine Hip ABduction/ADduction: AAROM;Left;10 reps;Supine Straight Leg Raises: AAROM;Left;Other reps (comment);Supine (2)    General Comments        Pertinent Vitals/Pain Pain Assessment: Faces Faces Pain Scale: Hurts a little bit Pain Location: L hip and thigh Pain Descriptors / Indicators: Sore Pain Intervention(s): Monitored during session;Repositioned    Home Living Family/patient expects to be discharged to:: Private residence Living Arrangements: Alone Available Help at Discharge: Family;Available PRN/intermittently Type of Home: House Home Access: Level entry   Home Layout: One level Home Equipment: None Additional Comments: Pt states he will walk to his sister's home and shower there     Prior Function Level of Independence: Independent      Comments: pt stated that he weight lifts in his backyard on a regular basis   PT Goals (current goals can now be found in the care plan section) Acute Rehab PT Goals Patient Stated Goal: return home and start working out again PT Goal Formulation:  With patient Time For Goal Achievement: 11/24/15 Potential to Achieve Goals: Good Progress towards PT goals: Progressing toward goals    Frequency  7X/week    PT Plan Current plan remains  appropriate    Co-evaluation             End of Session Equipment Utilized During Treatment: Gait belt Activity Tolerance: Patient limited by pain;Patient limited by fatigue Patient left: in bed;with call bell/phone within reach;with bed alarm set     Time: 1610-9604 PT Time Calculation (min) (ACUTE ONLY): 19 min  Charges:  $Gait Training: 8-22 mins                    G CodesAlessandra Bevels Alexsys Eskin 12/13/15, 2:58 PM Deborah Chalk, PT, DPT [phone]

## 2015-11-18 LAB — GLUCOSE, CAPILLARY
GLUCOSE-CAPILLARY: 104 mg/dL — AB (ref 65–99)
GLUCOSE-CAPILLARY: 145 mg/dL — AB (ref 65–99)
GLUCOSE-CAPILLARY: 140 mg/dL — AB (ref 65–99)
Glucose-Capillary: 127 mg/dL — ABNORMAL HIGH (ref 65–99)

## 2015-11-18 MED ORDER — ASPIRIN 325 MG PO TBEC: 325.0000 mg | DELAYED_RELEASE_TABLET | Freq: Every day | ORAL | 0 refills | Status: DC

## 2015-11-18 MED ORDER — OXYCODONE-ACETAMINOPHEN 5-325 MG PO TABS: 1.0000 | ORAL_TABLET | ORAL | 0 refills | Status: DC | PRN

## 2015-11-18 MED ORDER — TIZANIDINE HCL 4 MG PO TABS: 4.0000 mg | ORAL_TABLET | Freq: Four times a day (QID) | ORAL | 0 refills | Status: DC | PRN

## 2015-11-18 NOTE — Progress Notes (Signed)
Physical Therapy Treatment Patient Details Name: Keith Vaughn MRN: [id number] DOB: [birth year] Today's Date: 11/18/2015    History of Present Illness Pt is a 55 y/o male s/p L THA (direct anterior approach). PMH including but not limited to DM.    PT Comments    Pt presented supine in bed, awake and willing to participate in therapy session. Initially, pt declining therapy as he stated that he was "just walking around with my sister". With some encouragement, pt willing to participate. Pt making steady progress with therapy; however, he reported that he was very sore and believes that he "walked too soon" yesterday. PT educated pt on importance of movement and exercise following THA surgery and the importance of safety and limiting activity within tolerance. Pt requesting to perform push-ups and ambulate without RW during this session, but PT advised against at this time. Pt would continue to benefit from skilled physical therapy services at this time while admitted and after d/c to address his limitations in order to improve his overall safety and independence with functional mobility.   Follow Up Recommendations  Home health PT;Supervision for mobility/OOB     Equipment Recommendations  Rolling walker with 5" wheels    Recommendations for Other Services       Precautions / Restrictions Precautions Precautions: Fall Restrictions Weight Bearing Restrictions: Yes LLE Weight Bearing: Weight bearing as tolerated    Mobility  Bed Mobility Overal bed mobility: Needs Assistance Bed Mobility: Supine to Sit;Sit to Supine     Supine to sit: Supervision;HOB elevated Sit to supine: Min guard   General bed mobility comments: pt required increased time to complete  Transfers Overall transfer level: Needs assistance Equipment used: Rolling walker (2 wheeled) Transfers: Sit to/from Stand Sit to Stand: Supervision         General transfer comment: Pt requires supervision due to high  risk of falls, and impulsivity   Ambulation/Gait Ambulation/Gait assistance: Min guard Ambulation Distance (Feet): 150 Feet Assistive device: Rolling walker (2 wheeled) Gait Pattern/deviations: Step-through pattern;Decreased step length - right;Decreased stance time - left;Decreased weight shift to left Gait velocity: decreased Gait velocity interpretation: Below normal speed for age/gender General Gait Details: pt required frequent VC'ing for safety with RW and for foreward visual gaze   Stairs            Wheelchair Mobility    Modified Rankin (Stroke Patients Only)       Balance Overall balance assessment: Needs assistance Sitting-balance support: Feet supported;No upper extremity supported Sitting balance-Leahy Scale: Good     Standing balance support: During functional activity;No upper extremity supported Standing balance-Leahy Scale: Fair                      Cognition Arousal/Alertness: Awake/alert Behavior During Therapy: WFL for tasks assessed/performed;Impulsive Overall Cognitive Status: Within Functional Limits for tasks assessed                      Exercises Total Joint Exercises Heel Slides: AAROM;Left;10 reps;Supine Hip ABduction/ADduction: AAROM;Left;10 reps;Supine Straight Leg Raises: AAROM;Left;5 reps;Supine Marching in Standing: AROM;Strengthening;Both;10 reps;Standing Bridges: AROM;Strengthening;Both;10 reps;Supine    General Comments        Pertinent Vitals/Pain Pain Assessment: Faces Faces Pain Scale: Hurts a little bit Pain Location: L hip Pain Descriptors / Indicators: Sore Pain Intervention(s): Monitored during session;Repositioned    Home Living  Prior Function            PT Goals (current goals can now be found in the care plan section) Acute Rehab PT Goals Patient Stated Goal: return home and start working out again PT Goal Formulation: With patient Time For Goal  Achievement: 11/24/15 Potential to Achieve Goals: Good Progress towards PT goals: Progressing toward goals    Frequency  7X/week    PT Plan Current plan remains appropriate    Co-evaluation             End of Session Equipment Utilized During Treatment: Gait belt Activity Tolerance: Patient limited by pain;Patient limited by fatigue Patient left: in bed;with call bell/phone within reach     Time: 1433-1453 PT Time Calculation (min) (ACUTE ONLY): 20 min  Charges:  $Gait Training: 8-22 mins                    G CodesAlessandra Bevels [name] 12-09-15, 3:19 PM Deborah Chalk, PT, DPT [phone]

## 2015-11-18 NOTE — Progress Notes (Signed)
OT Cancellation Note  Patient Details Name: [name] MRN: [id number] DOB: [birth year]   Cancelled Treatment:    Reason Eval/Treat Not Completed: Fatigue/lethargy limiting ability to participate;Pain limiting ability to participate. Pt reports feeling "too tired from overdoing it with PT yesterday" and that he's too tired right now. Will check back later as able  Galen ManilaSpencer, Yemaya Barnier Jeanette 11/18/2015, 10:23 AM

## 2015-11-18 NOTE — Progress Notes (Signed)
PT Cancellation Note  Patient Details Name: Keith Vaughn MRN: [id number] DOB: Dec 22, 1960   Cancelled Treatment:    Reason Eval/Treat Not Completed: Patient declined, no reason specified. Pt reported feeling "too tired and sore" to participate in therapy session at this time. Pt refusing PT at this time. PT will continue to f/u with pt as appropriate.   Alessandra BevelsJennifer M Darnelle Derrick 11/18/2015, 9:00 AM Deborah ChalkJennifer Makaylen Thieme, PT, DPT [phone]

## 2015-11-18 NOTE — Care Management Note (Signed)
Case Management Note  Patient Details  Name: Keith Vaughn MRN: [id number] Date of Birth: Mar 27, 1960  Subjective/Objective:  S/p L THA                   Action/Plan: Discharge Planning:  NCM spoke to pt and lives at home. States sister, [name] and cousin, [name] [phone]. Cousin states pt can stay with him for a few weeks. [facility] and agree[facility] to [facility]. Pt preoperatively arranged for HH with [facility]. Pt requesting 3n1 and RW. Contacted [facility] for DME for home.   PCPFleet Contras- AVBUERE, [name]    Expected Discharge Date:  11/19/2015             Expected Discharge Plan:  Home w Home Health Services  In-House Referral:  NA  Discharge planning Services  CM Consult  Post Acute Care Choice:  Home Health Choice offered to:  Patient  DME Arranged:  3-N-1, Walker rolling DME Agency:  [facility]  HH Arranged:  PT [facility]  Status of Service:  Completed, signed off  If discussed at Long Length of Stay Meetings, dates discussed:    Additional Comments:  Elliot CousinShavis, Kouper Spinella Ellen, RN 11/18/2015, 5:39 PM

## 2015-11-18 NOTE — Discharge Instructions (Signed)

## 2015-11-18 NOTE — Progress Notes (Signed)
Subjective: 2 Days Post-Op Procedure(s) (LRB): LEFT TOTAL HIP ARTHROPLASTY ANTERIOR APPROACH (Left) Patient reports pain as moderate.    Objective: Vital signs in last 24 hours: Temp:  [98.4 F (36.9 C)-99.1 F (37.3 C)] 98.4 F (36.9 C) (09/14 0432) Pulse Rate:  [66-77] 77 (09/14 0432) Resp:  [16] 16 (09/14 0432) BP: (139-153)/(75-84) 153/84 (09/14 0432) SpO2:  [97 %-99 %] 99 % (09/14 0432)  Intake/Output from previous day: 09/13 0701 - 09/14 0700 In: 720 [P.O.:720] Out: 1350 [Urine:1350] Intake/Output this shift: No intake/output data recorded.   Recent Labs  11/17/15 0459  HGB 11.9*    Recent Labs  11/17/15 0459  WBC 8.1  RBC 4.13*  HCT 35.1*  PLT 195    Recent Labs  11/17/15 0459  NA 139  K 3.9  CL 105  CO2 27  BUN 18  CREATININE 1.35*  GLUCOSE 104*  CALCIUM 8.9   No results for input(s): LABPT, INR in the last 72 hours.  Sensation intact distally Intact pulses distally Dorsiflexion/Plantar flexion intact Incision: scant drainage  Assessment/Plan: 2 Days Post-Op Procedure(s) (LRB): LEFT TOTAL HIP ARTHROPLASTY ANTERIOR APPROACH (Left) Up with therapy Plan for discharge tomorrow Discharge home with home health  Kathryne HitchChristopher Y Willoughby Doell 11/18/2015, 7:07 AM

## 2015-11-19 LAB — GLUCOSE, CAPILLARY
GLUCOSE-CAPILLARY: 121 mg/dL — AB (ref 65–99)
GLUCOSE-CAPILLARY: 126 mg/dL — AB (ref 65–99)

## 2015-11-19 NOTE — Discharge Summary (Signed)
Patient ID: Keith Vaughn MRN: [id number] DOB/AGE: 10/08/1960 55 y.o.  Admit date: 11/16/2015 Discharge date: 11/19/2015  Admission Diagnoses:  Principal Problem:   Osteoarthritis of left hip Active Problems:   Status post left hip replacement   Discharge Diagnoses:  Same  Past Medical History:  Diagnosis Date  . Arthritis   . Diabetes mellitus without complication (HCC)     Surgeries: Procedure(s): LEFT TOTAL HIP ARTHROPLASTY ANTERIOR APPROACH on 11/16/2015   Consultants:   Discharged Condition: Improved  Hospital Course: Keith Vaughn is an 55 y.o. male who was admitted 11/16/2015 for operative treatment ofOsteoarthritis of left hip. Patient has severe unremitting pain that affects sleep, daily activities, and work/hobbies. After pre-op clearance the patient was taken to the operating room on 11/16/2015 and underwent  Procedure(s): LEFT TOTAL HIP ARTHROPLASTY ANTERIOR APPROACH.    Patient was given perioperative antibiotics: Anti-infectives    Start     Dose/Rate Route Frequency Ordered Stop   11/16/15 1830  ceFAZolin (ANCEF) IVPB 1 g/50 mL premix     1 g 100 mL/hr over 30 Minutes Intravenous Every 6 hours 11/16/15 1805 11/17/15 0100   11/16/15 1330  ceFAZolin (ANCEF) IVPB 2g/100 mL premix     2 g 200 mL/hr over 30 Minutes Intravenous To ShortStay Surgical 11/15/15 1050 11/16/15 1244       Patient was given sequential compression devices, early ambulation, and chemoprophylaxis to prevent DVT.  Patient benefited maximally from hospital stay and there were no complications.    Recent vital signs: Patient Vitals for the past 24 hrs:  BP Temp Temp src Pulse Resp SpO2  11/19/15 0505 129/76 98.6 F (37 C) Oral 80 16 100 %  11/18/15 2007 137/88 98.5 F (36.9 C) Oral 75 16 100 %  11/18/15 1300 (!) 153/87 98.4 F (36.9 C) Oral 78 16 100 %  11/18/15 0950 117/60 - - - - -     Recent laboratory studies:  Recent Labs  11/17/15 0459  WBC 8.1  HGB 11.9*  HCT 35.1*   PLT 195  NA 139  K 3.9  CL 105  CO2 27  BUN 18  CREATININE 1.35*  GLUCOSE 104*  CALCIUM 8.9     Discharge Medications:     Medication List    TAKE these medications   aspirin 325 MG EC tablet Take 1 tablet (325 mg total) by mouth daily.   meloxicam 7.5 MG tablet Commonly known as:  MOBIC Take 7.5 mg by mouth 2 (two) times daily.   metFORMIN 1000 MG tablet Commonly known as:  GLUCOPHAGE Take 500 mg by mouth 2 (two) times daily.   oxyCODONE-acetaminophen 5-325 MG tablet Commonly known as:  ROXICET Take 1-2 tablets by mouth every 4 (four) hours as needed.   tiZANidine 4 MG tablet Commonly known as:  ZANAFLEX Take 1 tablet (4 mg total) by mouth every 6 (six) hours as needed for muscle spasms.       Diagnostic Studies: Dg C-arm 1-60 Min  Result Date: 11/16/2015 CLINICAL DATA:  Status post left total hip arthroplasty. EXAM: OPERATIVE left HIP (WITH PELVIS IF PERFORMED) 2 VIEWS TECHNIQUE: Fluoroscopic spot image(s) were submitted for interpretation post-operatively. COMPARISON:  Bilateral hip series of September 01, 2015 FINDINGS: Fluoro time reported is 24 seconds. The patient has undergone left hip prosthesis placement. Radiographic positioning of the prosthetic components is good. The native bone appears intact. IMPRESSION: No immediate postprocedure complication following left total hip arthroplasty. Electronically Signed   By: David  SwazilandJordan M.D.  On: 11/16/2015 14:06   Dg Hip Port Unilat With Pelvis 1v Left  Result Date: 11/16/2015 CLINICAL DATA:  Status post left hip replacement EXAM: DG HIP (WITH OR WITHOUT PELVIS) 1V PORT LEFT COMPARISON:  None. FINDINGS: The patient is status post left hip replacement. The acetabular and femoral hardware is in good position on provided images. IMPRESSION: Appropriate placement of hardware. Electronically Signed   By: Gerome Sam III M.D   On: 11/16/2015 16:39   Dg Hip Operative Unilat W Or W/o Pelvis Left  Result Date:  11/16/2015 CLINICAL DATA:  Status post left total hip arthroplasty. EXAM: OPERATIVE left HIP (WITH PELVIS IF PERFORMED) 2 VIEWS TECHNIQUE: Fluoroscopic spot image(s) were submitted for interpretation post-operatively. COMPARISON:  Bilateral hip series of September 01, 2015 FINDINGS: Fluoro time reported is 24 seconds. The patient has undergone left hip prosthesis placement. Radiographic positioning of the prosthetic components is good. The native bone appears intact. IMPRESSION: No immediate postprocedure complication following left total hip arthroplasty. Electronically Signed   By: David  [place] M.D.   On: 11/16/2015 14:06    Disposition: 01-Home or Self Care  Discharge Instructions    Call MD / Call 911    Complete by:  As directed    If you experience chest pain or shortness of breath, CALL 911 and be transported to the hospital emergency room.  If you develope a fever above 101 F, pus (white drainage) or increased drainage or redness at the wound, or calf pain, call your surgeon's office.   Constipation Prevention    Complete by:  As directed    Drink plenty of fluids.  Prune juice may be helpful.  You may use a stool softener, such as Colace (over the counter) 100 mg twice a day.  Use MiraLax (over the counter) for constipation as needed.   Diet - low sodium heart healthy    Complete by:  As directed    Discharge patient    Complete by:  As directed    Increase activity slowly as tolerated    Complete by:  As directed       Follow-up Information    Kathryne Hitch, MD Follow up in 2 week(s).   Specialty:  Orthopedic Surgery Contact information: [address] [place] [address] [phone]        [facility] .   Why:  Home Health Physical Therapy Contact information: [address] [place] [address] [phone]            Signed: Kathryne Hitch 11/19/2015, 6:15 AM

## 2015-11-19 NOTE — Progress Notes (Signed)
Patient ID: Keith Vaughn, male   DOB: 1960/12/28, 55 y.o.   MRN: [id number] Doing well.  Can be discharged to home today.

## 2015-11-19 NOTE — Progress Notes (Signed)
Physical Therapy Treatment Patient Details Name: Keith Vaughn MRN: [id number] DOB: March 06, 1961 Today's Date: 11/19/2015    History of Present Illness Pt is a 55 y/o male s/p L THA (direct anterior approach). PMH including but not limited to DM.    PT Comments    Pt presented supine in bed, awake and willing to ambulate to his bathroom so that he could void. However, at that time he stated that he would not participate in any further therapeutic interventions at this time. Pt required frequent VC'ing for safety in standing and during ambulation with RW. Pt would continue to benefit from skilled physical therapy services at this time while admitted and after d/c to address his limitations in order to improve his overall safety and independence with functional mobility.   Follow Up Recommendations  Home health PT;Supervision for mobility/OOB     Equipment Recommendations  Rolling walker with 5" wheels    Recommendations for Other Services       Precautions / Restrictions Precautions Precautions: Fall Restrictions Weight Bearing Restrictions: Yes LLE Weight Bearing: Weight bearing as tolerated    Mobility  Bed Mobility Overal bed mobility: Needs Assistance Bed Mobility: Supine to Sit;Sit to Supine     Supine to sit: Supervision;HOB elevated Sit to supine: Min guard   General bed mobility comments: pt required increased time to complete  Transfers Overall transfer level: Needs assistance Equipment used: Rolling walker (2 wheeled) Transfers: Sit to/from Stand Sit to Stand: Supervision         General transfer comment: Pt requires supervision due to high risk of falls, and impulsivity   Ambulation/Gait Ambulation/Gait assistance: Min guard Ambulation Distance (Feet): 20 Feet (20 ft x2) Assistive device: Rolling walker (2 wheeled) Gait Pattern/deviations: Step-to pattern;Decreased step length - right;Decreased stance time - left;Decreased weight shift to left Gait  velocity: decreased Gait velocity interpretation: Below normal speed for age/gender General Gait Details: pt required frequent VC'ing for safety with RW   Stairs            Wheelchair Mobility    Modified Rankin (Stroke Patients Only)       Balance Overall balance assessment: Needs assistance Sitting-balance support: Feet supported;No upper extremity supported Sitting balance-Leahy Scale: Good     Standing balance support: During functional activity;No upper extremity supported Standing balance-Leahy Scale: Fair                      Cognition Arousal/Alertness: Awake/alert Behavior During Therapy: WFL for tasks assessed/performed;Impulsive Overall Cognitive Status: Within Functional Limits for tasks assessed                      Exercises      General Comments        Pertinent Vitals/Pain Pain Assessment: Faces Faces Pain Scale: Hurts a little bit Pain Location: L hip Pain Descriptors / Indicators: Guarding Pain Intervention(s): Monitored during session;Repositioned;Ice applied    Home Living                      Prior Function            PT Goals (current goals can now be found in the care plan section) Acute Rehab PT Goals Patient Stated Goal: return home and start working out again PT Goal Formulation: With patient Time For Goal Achievement: 11/24/15 Potential to Achieve Goals: Good Progress towards PT goals: Progressing toward goals    Frequency   PT Diagnosis  7X/week       PT Plan Current plan remains appropriate    Co-evaluation             End of Session Equipment Utilized During Treatment: Gait belt Activity Tolerance: Patient limited by pain Patient left: in bed;with call bell/phone within reach     Time: 2956-21300941-0953 PT Time Calculation (min) (ACUTE ONLY): 12 min  Charges:  $Gait Training: 8-22 mins                    G CodesAlessandra Bevels:      [name] 11/19/2015, 11:18 AM [name], PT,  DPT [phone]

## 2015-11-19 NOTE — Progress Notes (Signed)
Reviewed discharge information/medications with patient.  Answered all of his questions.  Waiting on his ride.

## 2015-11-23 NOTE — Anesthesia Postprocedure Evaluation (Signed)
Anesthesia Post Note  Patient: Keith Vaughn  Procedure(s) Performed: Procedure(s) (LRB): LEFT TOTAL HIP ARTHROPLASTY ANTERIOR APPROACH (Left)  Patient location during evaluation: PACU Anesthesia Type: General Level of consciousness: awake and alert Pain management: pain level controlled Vital Signs Assessment: post-procedure vital signs reviewed and stable Respiratory status: spontaneous breathing, nonlabored ventilation, respiratory function stable and patient connected to nasal cannula oxygen Cardiovascular status: blood pressure returned to baseline and stable Postop Assessment: no signs of nausea or vomiting Anesthetic complications: no    Last Vitals:  Vitals:   11/18/15 2007 11/19/15 0505  BP: 137/88 129/76  Pulse: 75 80  Resp: 16 16  Temp: 36.9 C 37 C    Last Pain:  Vitals:   11/19/15 1001  TempSrc:   PainSc: 7                  Tysin Salada Astrid DivineS Loveda Colaizzi

## 2015-12-29 ENCOUNTER — Encounter (INDEPENDENT_AMBULATORY_CARE_PROVIDER_SITE_OTHER): Payer: Self-pay | Admitting: Orthopaedic Surgery

## 2015-12-29 ENCOUNTER — Ambulatory Visit (INDEPENDENT_AMBULATORY_CARE_PROVIDER_SITE_OTHER): Payer: Medicaid Other | Admitting: Orthopaedic Surgery

## 2015-12-29 DIAGNOSIS — Z96642 Presence of left artificial hip joint: Secondary | ICD-10-CM

## 2015-12-29 NOTE — Progress Notes (Signed)
   Office Visit Note   Patient: Keith Vaughn           Date of Birth: Nov 23, 1960           MRN: [id number] Visit Date: 12/29/2015              Requested by: Fleet ContrasEdwin Avbuere, MD [id number] [place], [place] [id number] PCP: Dorrene GermanEdwin A Avbuere, MD   Assessment & Plan: Visit Diagnoses:  1. Status post total hip replacement, left     Plan: Doing great. Follow-up 4 weeks  Follow-Up Instructions: Return in about 4 weeks (around 01/26/2016).   Orders:  No orders of the defined types were placed in this encounter.  Meds ordered this encounter  Medications  . metFORMIN (GLUCOPHAGE) 500 MG tablet    Refill:  0  . ACCU-CHEK AVIVA PLUS test strip    Refill:  1      Procedures: No procedures performed   Clinical Data: No additional findings.   Subjective: Chief Complaint  Patient presents with  . Left Hip - Routine Post Op    Doing very well, getting good ROM and strength back in the leg Lt THA 11/16/15. Taking his oxycodone.          HPI  Review of Systems   Objective: Vital Signs: There were no vitals taken for this visit.  Physical Exam  Ortho Exam Excellent ROM of left hip Incision well healed. Minimal swelling   Specialty Comments:  No specialty comments available.  Imaging: No results found.   PMFS History: Patient Active Problem List   Diagnosis Date Noted  . Osteoarthritis of left hip 11/16/2015  . Status post left hip replacement 11/16/2015   Past Medical History:  Diagnosis Date  . Arthritis   . Diabetes mellitus without complication (HCC)     No family history on file.  Past Surgical History:  Procedure Laterality Date  . HAND SURGERY Right    bolts and screws  . TOTAL HIP ARTHROPLASTY Left 11/16/2015   Procedure: LEFT TOTAL HIP ARTHROPLASTY ANTERIOR APPROACH;  Surgeon: Kathryne Hitchhristopher Y Avel Ogawa, MD;  Location: [facility] OR;  Service: Orthopedics;  Laterality: Left;   Social History   Occupational History  . Not on file.   Social History  Main Topics  . Smoking status: Current Every Day Smoker    Types: Cigarettes  . Smokeless tobacco: Never Used     Comment: onlt 3 cigarettes  a day  . Alcohol use No     Comment: quit 5 years ago  . Drug use:     Types: "Crack" cocaine, Cocaine, Marijuana     Comment: none used in almost 5 years  . Sexual activity: Not on file

## 2016-01-26 ENCOUNTER — Ambulatory Visit (INDEPENDENT_AMBULATORY_CARE_PROVIDER_SITE_OTHER): Payer: Medicaid Other | Admitting: Orthopaedic Surgery

## 2017-06-20 ENCOUNTER — Ambulatory Visit (INDEPENDENT_AMBULATORY_CARE_PROVIDER_SITE_OTHER): Payer: Medicaid Other | Admitting: Orthopaedic Surgery

## 2017-06-20 ENCOUNTER — Ambulatory Visit (INDEPENDENT_AMBULATORY_CARE_PROVIDER_SITE_OTHER): Payer: Medicaid Other

## 2017-06-20 ENCOUNTER — Encounter (INDEPENDENT_AMBULATORY_CARE_PROVIDER_SITE_OTHER): Payer: Self-pay | Admitting: Orthopaedic Surgery

## 2017-06-20 DIAGNOSIS — M1611 Unilateral primary osteoarthritis, right hip: Secondary | ICD-10-CM

## 2017-06-20 DIAGNOSIS — M25551 Pain in right hip: Secondary | ICD-10-CM | POA: Diagnosis not present

## 2017-06-20 NOTE — Progress Notes (Signed)
Office Visit Note   Patient: Keith Vaughn           Date of Birth: [birth year]           MRN: [id number] Visit Date: 06/20/2017              Requested by: Fleet Contras, MD [address] [place], [place] [address] PCP: Fleet Contras, MD   Assessment & Plan: Visit Diagnoses:  1. Pain in right hip     Plan: [name] is a status post a left total hip arthroplasty 11/16/2015 and understands the postoperative process he also understands the risks and benefits including but not limited to DVT PE nerve vessel injury blood loss.  The fact that he is having severe pain in the right hip along with the radiographic findings and clinical findings recommend right total hip arthroplasty.  The only other option would be an intra-articular injection of the right hip and he wishes to just have the total hip performed as he is done well with his left total hip.  Pain in the hip is affecting his daily activities.  Questions were encouraged and answered at length we will see him back in 2 weeks postop.  Follow-Up Instructions: Return in about 2 weeks (around 07/04/2017).   Orders:  Orders Placed This Encounter  Procedures  . XR HIP UNILAT W OR W/O PELVIS 1V RIGHT   No orders of the defined types were placed in this encounter.     Procedures: No procedures performed   Clinical Data: No additional findings.   Subjective: Chief Complaint  Patient presents with  . Right Hip - Pain    HPI Keith Vaughn is well-known to Dr. Magnus Ivan service comes in today due to right hip and groin pain.  He states he has had the pain for months but it became worse over the last 3-4 weeks.  He is having difficulty walking putting on shoes socks.  He states he cannot work out as he would like to due to the right hip pain.  He is now 19 months status post a left total hip arthroplasty and is doing well. Review of Systems No radicular symptoms bilateral lower extremities.  Positive for right knee hip groin pain.   Otherwise review of systems negative please see HPI.  Objective: Vital Signs: There were no vitals taken for this visit.  Physical Exam  Constitutional: He is oriented to person, place, and time. He appears well-developed and well-nourished. No distress.  Cardiovascular: Intact distal pulses.  Pulmonary/Chest: Effort normal.  Neurological: He is alert and oriented to person, place, and time.  Skin: He is not diaphoretic.  Psychiatric: He has a normal mood and affect.    Ortho Exam Left hip full range of motion without pain.  Left calf supple nontender.  Right hip he has pain with internal and external rotation and diminished internal rotation of the right hip compared to left hip.  Right calf supple nontender.   Right leg is just slightly shorter than the left. Specialty Comments:  No specialty comments available.  Imaging: Xr Hip Unilat W Or W/o Pelvis 1v Right  Result Date: 06/20/2017 AP pelvis lateral view of the right hip: Bilateral hips well located.  No acute fractures.  Status post left total hip arthroplasty with well-seated components.  Right hip compared to films in 2017 showed progression of the joint space narrowing is near bone-on-bone at this point.  No other bony abnormalities.    PMFS History: Patient  Active Problem List   Diagnosis Date Noted  . Osteoarthritis of left hip 11/16/2015  . Status post left hip replacement 11/16/2015   Past Medical History:  Diagnosis Date  . Arthritis   . Diabetes mellitus without complication (HCC)     No family history on file.  Past Surgical History:  Procedure Laterality Date  . HAND SURGERY Right    bolts and screws  . TOTAL HIP ARTHROPLASTY Left 11/16/2015   Procedure: LEFT TOTAL HIP ARTHROPLASTY ANTERIOR APPROACH;  Surgeon: Kathryne Hitchhristopher Y Blackman, MD;  Location: [facility] OR;  Service: Orthopedics;  Laterality: Left;   Social History   Occupational History  . Not on file  Tobacco Use  . Smoking status: Current Every Day  Smoker    Types: Cigarettes  . Smokeless tobacco: Never Used  . Tobacco comment: onlt 3 cigarettes  a day  Substance and Sexual Activity  . Alcohol use: No    Comment: quit 5 years ago  . Drug use: Yes    Types: "Crack" cocaine, Cocaine, Marijuana    Comment: none used in almost 5 years  . Sexual activity: Not on file

## 2017-06-29 ENCOUNTER — Other Ambulatory Visit (HOSPITAL_COMMUNITY): Payer: Self-pay | Admitting: Physician Assistant

## 2017-06-29 NOTE — Pre-Procedure Instructions (Signed)
Keith Vaughn  06/29/2017    Your procedure is scheduled on Tuesday, Jul 10, 2017 at 2:50 PM.   [facility] Office at 12:50 PM.   Call this number if you have problems the morning of surgery: [phone]   Questions prior to day of surgery, please call [phone] between 8 & 4 PM.   Remember:  Do not eat food or drink liquids after midnight Monday, 07/09/17.   Do not take Metformin the day of surgery.   How to Manage Your Diabetes Before Surgery   Why is it important to control my blood sugar before and after surgery?   Improving blood sugar levels before and after surgery helps healing and can limit problems.  A way of improving blood sugar control is eating a healthy diet by:  - Eating less sugar and carbohydrates  - Increasing activity/exercise  - Talk with your doctor about reaching your blood sugar goals  High blood sugars (greater than 180 mg/dL) can raise your risk of infections and slow down your recovery so you will need to focus on controlling your diabetes during the weeks before surgery.  Make sure that the doctor who takes care of your diabetes knows about your planned surgery including the date and location.   Do not wear jewelry.  Do not wear lotions, powders, cologne or deodorant.  Men may shave face and neck.  Do not bring valuables to the hospital.  [facility] is not responsible for any belongings or valuables.  Contacts, dentures or bridgework may not be worn into surgery.  Leave your suitcase in the car.  After surgery it may be brought to your room.  For patients admitted to the hospital, discharge time will be determined by your treatment team.  [facility] - Preparing [facility]  Before surgery, you can play an important role.  Because skin is not sterile, your skin needs to be as free of germs as possible.  You can reduce the number of germs on you skin by washing with CHG (chlorahexidine gluconate) soap  before surgery.  CHG is an antiseptic cleaner which kills germs and bonds with the skin to continue killing germs even after washing.  Please DO NOT use if you have an allergy to CHG or antibacterial soaps.  If your skin becomes reddened/irritated stop using the CHG and inform your nurse when you arrive at Short Stay.  Do not shave (including legs and underarms) for at least 48 hours prior to the first CHG shower.  You may shave your face.  Please follow these instructions carefully:   1.  Shower with CHG Soap the night before surgery and the                    morning of Surgery.  2.  If you choose to wash your hair, wash your hair first as usual with your       normal shampoo.  3.  After you shampoo, rinse your hair and body thoroughly to remove the shampoo.  4.  Use CHG as you would any other liquid soap.  You can apply chg directly       to the skin and wash gently with scrungie or a clean washcloth.  5.  Apply the CHG Soap to your body ONLY FROM THE NECK DOWN.        Do not use on open wounds or open sores.  Avoid contact with your eyes,  ears, mouth and genitals (private parts).  Wash genitals (private parts) with your normal soap.  6.  Wash thoroughly, paying special attention to the area where your surgery        will be performed.  7.  Thoroughly rinse your body with warm water from the neck down.  8.  DO NOT shower/wash with your normal soap after using and rinsing off       the CHG Soap.  9.  Pat yourself dry with a clean towel.            10.  Wear clean pajamas.            11.  Place clean sheets on your bed the night of your first shower and do not        sleep with pets.  Day of Surgery  Shower as above. Do not apply any lotions/deodorants the morning of surgery.  Please wear clean clothes to the hospital.   Please read over the fact sheets that you were given.

## 2017-07-02 ENCOUNTER — Encounter (HOSPITAL_COMMUNITY): Payer: Self-pay

## 2017-07-02 ENCOUNTER — Other Ambulatory Visit (HOSPITAL_COMMUNITY): Payer: Self-pay | Admitting: *Deleted

## 2017-07-02 ENCOUNTER — Other Ambulatory Visit: Payer: Self-pay

## 2017-07-02 ENCOUNTER — Encounter (HOSPITAL_COMMUNITY)
Admission: RE | Admit: 2017-07-02 | Discharge: 2017-07-02 | Disposition: A | Discharge status: Home / Self Care | Payer: Medicaid Other | Source: Ambulatory Visit | Attending: Orthopaedic Surgery | Admitting: Orthopaedic Surgery

## 2017-07-02 DIAGNOSIS — E119 Type 2 diabetes mellitus without complications: Secondary | ICD-10-CM | POA: Insufficient documentation

## 2017-07-02 DIAGNOSIS — Z01812 Encounter for preprocedural laboratory examination: Secondary | ICD-10-CM | POA: Diagnosis present

## 2017-07-02 DIAGNOSIS — Z01810 Encounter for preprocedural cardiovascular examination: Secondary | ICD-10-CM | POA: Insufficient documentation

## 2017-07-02 LAB — CBC
HCT: 43.1 % (ref 39.0–52.0)
Hemoglobin: 14.3 g/dL (ref 13.0–17.0)
MCH: 28.4 pg (ref 26.0–34.0)
MCHC: 33.2 g/dL (ref 30.0–36.0)
MCV: 85.5 fL (ref 78.0–100.0)
Platelets: 217 10*3/uL (ref 150–400)
RBC: 5.04 MIL/uL (ref 4.22–5.81)
RDW: 13.7 % (ref 11.5–15.5)
WBC: 5.4 10*3/uL (ref 4.0–10.5)

## 2017-07-02 LAB — BASIC METABOLIC PANEL
Anion gap: 6 (ref 5–15)
BUN: 13 mg/dL (ref 6–20)
CALCIUM: 9.5 mg/dL (ref 8.9–10.3)
CO2: 31 mmol/L (ref 22–32)
Chloride: 105 mmol/L (ref 101–111)
Creatinine, Ser: 1.28 mg/dL — ABNORMAL HIGH (ref 0.61–1.24)
GFR calc Af Amer: >60 mL/min (ref >60)
GLUCOSE: 83 mg/dL (ref 65–99)
Potassium: 4.1 mmol/L (ref 3.5–5.1)
Sodium: 142 mmol/L (ref 135–145)

## 2017-07-02 LAB — SURGICAL PCR SCREEN
MRSA, PCR: NEGATIVE
STAPHYLOCOCCUS AUREUS: NEGATIVE

## 2017-07-02 LAB — GLUCOSE, CAPILLARY: GLUCOSE-CAPILLARY: 135 mg/dL — AB (ref 65–99)

## 2017-07-02 LAB — HEMOGLOBIN A1C
HEMOGLOBIN A1C: 5.8 % — AB (ref 4.8–5.6)
Mean Plasma Glucose: 119.76 mg/dL

## 2017-07-02 NOTE — Progress Notes (Signed)
PCP: Dr. Kristeen Miss, pt reports he sees every 90 days for DM follow up Cardiologist: Denies  EKG: Today CXR: Denies ECHO: Denies Stress Test: Denies Cardiac Cath: Denies  Fasting Blood Sugar- "90s" Checks Blood Sugar every 3 days.  Patient reports nasal congestion, clear mucous when blowing nose--worse since last week mowing yards/doing yard work.  Pt encouraged to use OTC allergy medicine for relief and to call surgeon's office/PCP if symptoms not improved. Verbalized understanding.  Patient denies shortness of breath, fever, cough, and chest pain at PAT appointment.  Patient verbalized understanding of instructions provided today at the PAT appointment.  Patient asked to review instructions at home and day of surgery.

## 2017-07-05 ENCOUNTER — Other Ambulatory Visit (INDEPENDENT_AMBULATORY_CARE_PROVIDER_SITE_OTHER): Payer: Self-pay

## 2017-07-09 MED ORDER — CEFAZOLIN SODIUM-DEXTROSE 2-4 GM/100ML-% IV SOLN
2.0000 g | INTRAVENOUS | Status: AC
  Administered 2017-07-10: 2 g via INTRAVENOUS
  Filled 2017-07-09: qty 100

## 2017-07-09 MED ORDER — TRANEXAMIC ACID 1000 MG/10ML IV SOLN
1000.0000 mg | INTRAVENOUS | Status: AC
  Administered 2017-07-10: 1000 mg via INTRAVENOUS
  Filled 2017-07-09: qty 1100

## 2017-07-10 ENCOUNTER — Encounter (HOSPITAL_COMMUNITY)
Admission: RE | Disposition: A | Discharge status: Home Health | Payer: Self-pay | Source: Ambulatory Visit | Attending: Orthopaedic Surgery

## 2017-07-10 ENCOUNTER — Inpatient Hospital Stay (HOSPITAL_COMMUNITY): Payer: Medicaid Other | Admitting: Certified Registered"

## 2017-07-10 ENCOUNTER — Inpatient Hospital Stay (HOSPITAL_COMMUNITY): Payer: Medicaid Other

## 2017-07-10 ENCOUNTER — Inpatient Hospital Stay (HOSPITAL_COMMUNITY)
Admission: RE | Admit: 2017-07-10 | Discharge: 2017-07-13 | DRG: 470 | Disposition: A | Discharge status: Home Health | Payer: Medicaid Other | Source: Ambulatory Visit | Attending: Orthopaedic Surgery | Admitting: Orthopaedic Surgery

## 2017-07-10 ENCOUNTER — Encounter (HOSPITAL_COMMUNITY): Payer: Self-pay | Admitting: *Deleted

## 2017-07-10 DIAGNOSIS — Z96641 Presence of right artificial hip joint: Secondary | ICD-10-CM

## 2017-07-10 DIAGNOSIS — F17210 Nicotine dependence, cigarettes, uncomplicated: Secondary | ICD-10-CM | POA: Diagnosis present

## 2017-07-10 DIAGNOSIS — E119 Type 2 diabetes mellitus without complications: Secondary | ICD-10-CM | POA: Diagnosis present

## 2017-07-10 DIAGNOSIS — Z419 Encounter for procedure for purposes other than remedying health state, unspecified: Secondary | ICD-10-CM

## 2017-07-10 DIAGNOSIS — Z7984 Long term (current) use of oral hypoglycemic drugs: Secondary | ICD-10-CM

## 2017-07-10 DIAGNOSIS — M1611 Unilateral primary osteoarthritis, right hip: Principal | ICD-10-CM

## 2017-07-10 DIAGNOSIS — Z96642 Presence of left artificial hip joint: Secondary | ICD-10-CM | POA: Diagnosis present

## 2017-07-10 HISTORY — PX: TOTAL HIP ARTHROPLASTY: SHX124

## 2017-07-10 LAB — GLUCOSE, CAPILLARY
GLUCOSE-CAPILLARY: 87 mg/dL (ref 65–99)
Glucose-Capillary: 90 mg/dL (ref 65–99)
Glucose-Capillary: 104 mg/dL — ABNORMAL HIGH (ref 65–99)

## 2017-07-10 SURGERY — ARTHROPLASTY, HIP, TOTAL, ANTERIOR APPROACH
Anesthesia: General | Site: Hip | Laterality: Right

## 2017-07-10 MED ORDER — ROCURONIUM BROMIDE 50 MG/5ML IV SOLN
INTRAVENOUS | Status: AC
Start: 2017-07-10 — End: ?
  Filled 2017-07-10: qty 2

## 2017-07-10 MED ORDER — DIPHENHYDRAMINE HCL 12.5 MG/5ML PO ELIX: 12.5000 mg | ORAL_SOLUTION | ORAL | Status: DC | PRN

## 2017-07-10 MED ORDER — CHLORHEXIDINE GLUCONATE 4 % EX LIQD: 60.0000 mL | Freq: Once | CUTANEOUS | Status: DC

## 2017-07-10 MED ORDER — SODIUM CHLORIDE 0.9 % IR SOLN
Status: DC | PRN
  Administered 2017-07-10: 3000 mL

## 2017-07-10 MED ORDER — PANTOPRAZOLE SODIUM 40 MG PO TBEC
40.0000 mg | DELAYED_RELEASE_TABLET | Freq: Every evening | ORAL | Status: DC
  Administered 2017-07-10 – 2017-07-12 (×3): 40 mg via ORAL
  Filled 2017-07-10 (×3): qty 1

## 2017-07-10 MED ORDER — ONDANSETRON HCL 4 MG/2ML IJ SOLN
INTRAMUSCULAR | Status: AC
  Filled 2017-07-10: qty 6

## 2017-07-10 MED ORDER — MEPERIDINE HCL 50 MG/ML IJ SOLN: 6.2500 mg | INTRAMUSCULAR | Status: DC | PRN

## 2017-07-10 MED ORDER — HYDROMORPHONE HCL 2 MG/ML IJ SOLN
0.2500 mg | INTRAMUSCULAR | Status: DC | PRN
  Administered 2017-07-10 (×3): 0.5 mg via INTRAVENOUS

## 2017-07-10 MED ORDER — METOCLOPRAMIDE HCL 5 MG/ML IJ SOLN: 5.0000 mg | Freq: Three times a day (TID) | INTRAMUSCULAR | Status: DC | PRN

## 2017-07-10 MED ORDER — OXYCODONE HCL 5 MG PO TABS
10.0000 mg | ORAL_TABLET | ORAL | Status: DC | PRN
  Administered 2017-07-10: 10 mg via ORAL
  Administered 2017-07-11 – 2017-07-13 (×10): 15 mg via ORAL
  Filled 2017-07-10 (×10): qty 3

## 2017-07-10 MED ORDER — OXYCODONE HCL 5 MG PO TABS
5.0000 mg | ORAL_TABLET | ORAL | Status: DC | PRN
  Administered 2017-07-11 – 2017-07-12 (×2): 10 mg via ORAL
  Filled 2017-07-10 (×2): qty 2

## 2017-07-10 MED ORDER — METHOCARBAMOL 500 MG PO TABS
ORAL_TABLET | ORAL | Status: AC
  Filled 2017-07-10: qty 1

## 2017-07-10 MED ORDER — ARTIFICIAL TEARS OPHTHALMIC OINT
TOPICAL_OINTMENT | OPHTHALMIC | Status: AC
  Filled 2017-07-10: qty 3.5

## 2017-07-10 MED ORDER — PROPOFOL 500 MG/50ML IV EMUL
INTRAVENOUS | Status: DC | PRN
  Administered 2017-07-10: 100 ug/kg/min via INTRAVENOUS

## 2017-07-10 MED ORDER — HYDROMORPHONE HCL 2 MG/ML IJ SOLN
INTRAMUSCULAR | Status: AC
  Administered 2017-07-10: 0.5 mg via INTRAVENOUS
  Filled 2017-07-10: qty 1

## 2017-07-10 MED ORDER — LIDOCAINE 2% (20 MG/ML) 5 ML SYRINGE
INTRAMUSCULAR | Status: DC | PRN
  Administered 2017-07-10: 60 mg via INTRAVENOUS

## 2017-07-10 MED ORDER — PROMETHAZINE HCL 25 MG/ML IJ SOLN: 6.2500 mg | INTRAMUSCULAR | Status: DC | PRN

## 2017-07-10 MED ORDER — CEFAZOLIN SODIUM-DEXTROSE 1-4 GM/50ML-% IV SOLN
1.0000 g | Freq: Four times a day (QID) | INTRAVENOUS | Status: AC
  Administered 2017-07-10 – 2017-07-11 (×2): 1 g via INTRAVENOUS
  Filled 2017-07-10 (×2): qty 50

## 2017-07-10 MED ORDER — DOCUSATE SODIUM 100 MG PO CAPS
100.0000 mg | ORAL_CAPSULE | Freq: Two times a day (BID) | ORAL | Status: DC
  Administered 2017-07-10 – 2017-07-13 (×6): 100 mg via ORAL
  Filled 2017-07-10 (×6): qty 1

## 2017-07-10 MED ORDER — ONDANSETRON HCL 4 MG/2ML IJ SOLN
4.0000 mg | Freq: Four times a day (QID) | INTRAMUSCULAR | Status: DC | PRN
  Administered 2017-07-10 – 2017-07-11 (×2): 4 mg via INTRAVENOUS
  Filled 2017-07-10 (×2): qty 2

## 2017-07-10 MED ORDER — LACTATED RINGERS IV SOLN
INTRAVENOUS | Status: DC
  Administered 2017-07-10: 11:00:00 via INTRAVENOUS

## 2017-07-10 MED ORDER — BUPIVACAINE IN DEXTROSE 0.75-8.25 % IT SOLN
INTRATHECAL | Status: DC | PRN
  Administered 2017-07-10: 2 mL via INTRATHECAL

## 2017-07-10 MED ORDER — OXYCODONE HCL 5 MG PO TABS
ORAL_TABLET | ORAL | Status: AC
  Filled 2017-07-10: qty 2

## 2017-07-10 MED ORDER — POLYETHYLENE GLYCOL 3350 17 G PO PACK: 17.0000 g | PACK | Freq: Every day | ORAL | Status: DC | PRN

## 2017-07-10 MED ORDER — DEXAMETHASONE SODIUM PHOSPHATE 10 MG/ML IJ SOLN
INTRAMUSCULAR | Status: AC
Start: 2017-07-10 — End: ?
  Filled 2017-07-10: qty 2

## 2017-07-10 MED ORDER — FENTANYL CITRATE (PF) 250 MCG/5ML IJ SOLN
INTRAMUSCULAR | Status: AC
  Filled 2017-07-10: qty 5

## 2017-07-10 MED ORDER — METOCLOPRAMIDE HCL 5 MG PO TABS: 5.0000 mg | ORAL_TABLET | Freq: Three times a day (TID) | ORAL | Status: DC | PRN

## 2017-07-10 MED ORDER — PHENOL 1.4 % MT LIQD: 1.0000 | OROMUCOSAL | Status: DC | PRN

## 2017-07-10 MED ORDER — MIDAZOLAM HCL 2 MG/2ML IJ SOLN
INTRAMUSCULAR | Status: AC
  Filled 2017-07-10: qty 2

## 2017-07-10 MED ORDER — LIDOCAINE 2% (20 MG/ML) 5 ML SYRINGE
INTRAMUSCULAR | Status: AC
  Filled 2017-07-10: qty 5

## 2017-07-10 MED ORDER — ONDANSETRON HCL 4 MG PO TABS: 4.0000 mg | ORAL_TABLET | Freq: Four times a day (QID) | ORAL | Status: DC | PRN

## 2017-07-10 MED ORDER — ACETAMINOPHEN 325 MG PO TABS
325.0000 mg | ORAL_TABLET | Freq: Four times a day (QID) | ORAL | Status: DC | PRN
  Administered 2017-07-11 – 2017-07-12 (×3): 650 mg via ORAL
  Filled 2017-07-10 (×3): qty 2

## 2017-07-10 MED ORDER — SODIUM CHLORIDE 0.9 % IV SOLN: INTRAVENOUS | Status: DC

## 2017-07-10 MED ORDER — HYDROMORPHONE HCL 2 MG/ML IJ SOLN
0.5000 mg | INTRAMUSCULAR | Status: DC | PRN
  Administered 2017-07-10 – 2017-07-11 (×3): 1 mg via INTRAVENOUS
  Filled 2017-07-10 (×3): qty 1

## 2017-07-10 MED ORDER — MENTHOL 3 MG MT LOZG: 1.0000 | LOZENGE | OROMUCOSAL | Status: DC | PRN

## 2017-07-10 MED ORDER — ASPIRIN 81 MG PO CHEW
81.0000 mg | CHEWABLE_TABLET | Freq: Two times a day (BID) | ORAL | Status: DC
  Administered 2017-07-10 – 2017-07-13 (×6): 81 mg via ORAL
  Filled 2017-07-10 (×6): qty 1

## 2017-07-10 MED ORDER — FENTANYL CITRATE (PF) 100 MCG/2ML IJ SOLN
INTRAMUSCULAR | Status: DC | PRN
  Administered 2017-07-10: 50 ug via INTRAVENOUS
  Administered 2017-07-10: 25 ug via INTRAVENOUS
  Administered 2017-07-10: 50 ug via INTRAVENOUS

## 2017-07-10 MED ORDER — 0.9 % SODIUM CHLORIDE (POUR BTL) OPTIME
TOPICAL | Status: DC | PRN
  Administered 2017-07-10: 1000 mL

## 2017-07-10 MED ORDER — METHOCARBAMOL 500 MG PO TABS
500.0000 mg | ORAL_TABLET | Freq: Four times a day (QID) | ORAL | Status: DC | PRN
  Administered 2017-07-10 – 2017-07-13 (×9): 500 mg via ORAL
  Filled 2017-07-10 (×8): qty 1

## 2017-07-10 MED ORDER — MIDAZOLAM HCL 5 MG/5ML IJ SOLN
INTRAMUSCULAR | Status: DC | PRN
  Administered 2017-07-10: 2 mg via INTRAVENOUS

## 2017-07-10 MED ORDER — METHOCARBAMOL 1000 MG/10ML IJ SOLN
500.0000 mg | Freq: Four times a day (QID) | INTRAVENOUS | Status: DC | PRN
  Filled 2017-07-10: qty 5

## 2017-07-10 MED ORDER — LACTATED RINGERS IV SOLN: INTRAVENOUS | Status: DC

## 2017-07-10 MED ORDER — METFORMIN HCL 500 MG PO TABS
500.0000 mg | ORAL_TABLET | Freq: Two times a day (BID) | ORAL | Status: DC
  Administered 2017-07-11 – 2017-07-13 (×5): 500 mg via ORAL
  Filled 2017-07-10 (×5): qty 1

## 2017-07-10 MED ORDER — PROPOFOL 10 MG/ML IV BOLUS
INTRAVENOUS | Status: DC | PRN
  Administered 2017-07-10: 70 mg via INTRAVENOUS
  Administered 2017-07-10: 50 mg via INTRAVENOUS

## 2017-07-10 SURGICAL SUPPLY — 54 items
APL SKNCLS STERI-STRIP NONHPOA (GAUZE/BANDAGES/DRESSINGS) ×1
BENZOIN TINCTURE PRP APPL 2/3 (GAUZE/BANDAGES/DRESSINGS) ×2 IMPLANT
BLADE CLIPPER SURG (BLADE) IMPLANT
BLADE SAW SGTL 18X1.27X75 (BLADE) ×2 IMPLANT
CAPT HIP TOTAL 2 ×2 IMPLANT
CELLS DAT CNTRL 66122 CELL SVR (MISCELLANEOUS) ×1 IMPLANT
COVER SURGICAL LIGHT HANDLE (MISCELLANEOUS) ×2 IMPLANT
DRAPE C-ARM 42X72 X-RAY (DRAPES) ×2 IMPLANT
DRAPE STERI IOBAN 125X83 (DRAPES) ×2 IMPLANT
DRAPE U-SHAPE 47X51 STRL (DRAPES) ×6 IMPLANT
DRESSING AQUACEL AG SP 3.5X10 (GAUZE/BANDAGES/DRESSINGS) IMPLANT
DRSG AQUACEL AG ADV 3.5X10 (GAUZE/BANDAGES/DRESSINGS) ×2 IMPLANT
DRSG AQUACEL AG SP 3.5X10 (GAUZE/BANDAGES/DRESSINGS) ×2
DURAPREP 26ML APPLICATOR (WOUND CARE) ×2 IMPLANT
ELECT BLADE 4.0 EZ CLEAN MEGAD (MISCELLANEOUS) ×2
ELECT BLADE 6.5 EXT (BLADE) IMPLANT
ELECT REM PT RETURN 9FT ADLT (ELECTROSURGICAL) ×2
ELECTRODE BLDE 4.0 EZ CLN MEGD (MISCELLANEOUS) ×1 IMPLANT
ELECTRODE REM PT RTRN 9FT ADLT (ELECTROSURGICAL) ×1 IMPLANT
FACESHIELD WRAPAROUND (MASK) ×4 IMPLANT
FACESHIELD WRAPAROUND OR TEAM (MASK) ×2 IMPLANT
GLOVE BIOGEL PI IND STRL 8 (GLOVE) ×2 IMPLANT
GLOVE BIOGEL PI INDICATOR 8 (GLOVE) ×2
GLOVE ECLIPSE 8.0 STRL XLNG CF (GLOVE) ×2 IMPLANT
GLOVE ORTHO TXT STRL SZ7.5 (GLOVE) ×4 IMPLANT
GOWN STRL REUS W/ TWL LRG LVL3 (GOWN DISPOSABLE) ×2 IMPLANT
GOWN STRL REUS W/ TWL XL LVL3 (GOWN DISPOSABLE) ×2 IMPLANT
GOWN STRL REUS W/TWL LRG LVL3 (GOWN DISPOSABLE) ×4
GOWN STRL REUS W/TWL XL LVL3 (GOWN DISPOSABLE) ×4
HANDPIECE INTERPULSE COAX TIP (DISPOSABLE) ×2
KIT BASIN OR (CUSTOM PROCEDURE TRAY) ×2 IMPLANT
KIT TURNOVER KIT B (KITS) ×2 IMPLANT
MANIFOLD NEPTUNE II (INSTRUMENTS) ×2 IMPLANT
NS IRRIG 1000ML POUR BTL (IV SOLUTION) ×2 IMPLANT
PACK TOTAL JOINT (CUSTOM PROCEDURE TRAY) ×2 IMPLANT
PAD ARMBOARD 7.5X6 YLW CONV (MISCELLANEOUS) ×2 IMPLANT
RETRACTOR WND ALEXIS 18 MED (MISCELLANEOUS) ×1 IMPLANT
RTRCTR WOUND ALEXIS 18CM MED (MISCELLANEOUS) ×2
SET HNDPC FAN SPRY TIP SCT (DISPOSABLE) ×1 IMPLANT
STAPLER VISISTAT 35W (STAPLE) IMPLANT
STRIP CLOSURE SKIN 1/2X4 (GAUZE/BANDAGES/DRESSINGS) ×3 IMPLANT
SUT ETHIBOND NAB CT1 #1 30IN (SUTURE) ×2 IMPLANT
SUT MNCRL AB 4-0 PS2 18 (SUTURE) IMPLANT
SUT VIC AB 0 CT1 27 (SUTURE) ×2
SUT VIC AB 0 CT1 27XBRD ANBCTR (SUTURE) ×1 IMPLANT
SUT VIC AB 1 CT1 27 (SUTURE) ×2
SUT VIC AB 1 CT1 27XBRD ANBCTR (SUTURE) ×1 IMPLANT
SUT VIC AB 2-0 CT1 27 (SUTURE) ×2
SUT VIC AB 2-0 CT1 TAPERPNT 27 (SUTURE) ×1 IMPLANT
TOWEL OR 17X24 6PK STRL BLUE (TOWEL DISPOSABLE) ×2 IMPLANT
TOWEL OR 17X26 10 PK STRL BLUE (TOWEL DISPOSABLE) ×2 IMPLANT
TRAY CATH 16FR W/PLASTIC CATH (SET/KITS/TRAYS/PACK) ×2 IMPLANT
TRAY FOLEY MTR SLVR 16FR STAT (SET/KITS/TRAYS/PACK) IMPLANT
WATER STERILE IRR 1000ML POUR (IV SOLUTION) ×4 IMPLANT

## 2017-07-10 NOTE — Progress Notes (Signed)
PT Cancellation Note  Patient Details Name: Keith Vaughn MRN: [id number] DOB: Sep 06, 1960   Cancelled Treatment:    Reason Eval/Treat Not Completed: Other (comment) Noted Pt's orders released at 15:47 and pt not in room at 17:44. Will follow up when pt in room and as schedule allows.   Gladys Damme, PT, DPT  Acute Rehabilitation Services  Pager: [phone]  [facility] Prom 07/10/2017, 5:43 PM

## 2017-07-10 NOTE — Anesthesia Procedure Notes (Signed)
Spinal  Patient location during procedure: OR Start time: 07/10/2017 1:40 PM End time: 07/10/2017 1:42 PM Staffing Anesthesiologist: Effie Berkshire, MD Performed: anesthesiologist  Preanesthetic Checklist Completed: patient identified, site marked, surgical consent, pre-op evaluation, timeout performed, IV checked, risks and benefits discussed and monitors and equipment checked Spinal Block Patient position: sitting Prep: Betadine Patient monitoring: heart rate, continuous pulse ox, blood pressure and cardiac monitor Approach: midline Location: L4-5 Injection technique: single-shot Needle Needle type: Introducer and Pencan  Needle gauge: 24 G Needle length: 9 cm Additional Notes Negative paresthesia. Negative blood return. Positive free-flowing CSF. Expiration date of kit checked and confirmed. Patient tolerated procedure well, without complications.

## 2017-07-10 NOTE — Transfer of Care (Signed)
Immediate Anesthesia Transfer of Care Note  Patient: Keith Vaughn  Procedure(s) Performed: RIGHT TOTAL HIP ARTHROPLASTY ANTERIOR APPROACH (Right Hip)  Patient Location: PACU  Anesthesia Type:MAC and Spinal  Level of Consciousness: drowsy  Airway & Oxygen Therapy: Patient Spontanous Breathing and Patient connected to face mask oxygen  Post-op Assessment: Report given to RN and Post -op Vital signs reviewed and stable  Post vital signs: Reviewed and stable  Last Vitals:  Vitals Value Taken Time  BP    Temp    Pulse 61 07/10/2017  3:29 PM  Resp 12 07/10/2017  3:29 PM  SpO2 100 % 07/10/2017  3:29 PM  Vitals shown include unvalidated device data.  Last Pain:  Vitals:   07/10/17 1052  TempSrc: Oral         Complications: No apparent anesthesia complications

## 2017-07-10 NOTE — Brief Op Note (Signed)
07/10/2017  3:07 PM  PATIENT:  Keith Vaughn  57 y.o. male  PRE-OPERATIVE DIAGNOSIS:  right hip osteoarthritis  POST-OPERATIVE DIAGNOSIS:  right hip osteoarthritis  PROCEDURE:  Procedure(s): RIGHT TOTAL HIP ARTHROPLASTY ANTERIOR APPROACH (Right)  SURGEON:  Surgeon(s) and Role:    Kathryne Hitch, MD - Primary  PHYSICIAN ASSISTANT:  Rexene Edison, PA-C assisted  ANESTHESIA:   spinal  EBL:  150 cc  COUNTS:  YES  DICTATION: .Other Dictation: Dictation Number [id number]  PLAN OF CARE: Admit to inpatient   PATIENT DISPOSITION:  PACU - hemodynamically stable.   Delay start of Pharmacological VTE agent (>24hrs) due to surgical blood loss or risk of bleeding: no

## 2017-07-10 NOTE — Op Note (Signed)
NAMEERVAN, Keith Vaughn ACCOUNT [id number] DATE OF BIRTH:June 12, 1960 FACILITY: [facility] LOCATION: [facility] PHYSICIAN:Denisa Enterline Aretha Parrot, MD  OPERATIVE REPORT  DATE OF PROCEDURE:  07/10/2017  PREOPERATIVE DIAGNOSIS:  Primary osteoarthritis and degenerative joint disease, right hip.  POSTOPERATIVE DIAGNOSIS:  Primary osteoarthritis and degenerative joint disease, right hip.  PROCEDURE:  Right total hip arthroplasty through direct anterior approach.  IMPLANTS:  DePuy Sector GRIPTION acetabular component size 52, size 36+0 polyethylene liner, size 11 Corail femoral component with varus offset, size 36+1.5 ceramic head ball.  SURGEON:  Doneen Poisson, MD  ASSISTANT:  Rexene Edison, PA-C  ANESTHESIA:  Spinal.  BLOOD LOSS:  150 mL.  ANTIBIOTICS:  2 grams IV Ancef.  COMPLICATIONS:  None.  INDICATIONS:  The patient is a 57 year old gentleman well known to me.  He has bilateral hip osteoarthritis and degenerative joint disease.  He underwent a successful left total hip arthroplasty in 11/2016.  His right hip does show radiographic evidence  of worsening arthritis.  His pain is daily and now that side is definitely affecting his activities of daily living, his quality of life and his mobility.  He does wish to proceed with a total hip arthroplasty at this point on the right side.  He  understands fully the risk of acute blood loss anemia, neurovascular injury, fracture, infection, dislocation, DVT.  He understands our goals are to decrease pain and improve mobility and overall improve the quality of life.  DESCRIPTION OF PROCEDURE:  After informed consent was obtained, appropriate right hip was marked.  He was brought to the operating room and sat up on a stretcher and spinal anesthesia was obtained.  He was then laid in the supine position on the  stretcher.  We measured his leg lengths and found that he is actually preoperatively shorter on the right side than  the left, although the intraoperative x-rays showed that he was longer and our postoperative x-rays from his last case and in the office  showed that he was on his leg lengths.  He was placed supine on the fracture Hana table with perineal posts in place and both legs in the skeletal traction device and no traction applied.  His right upper hip was prepped and draped with DuraPrep and  sterile drapes.  Time out was called to identify the correct patient, correct right hip.  We then made an incision just inferior and posterior to the anterior superior iliac spine and carried it obliquely down the leg.  We dissected down to tensor fascia  lata muscle.  The tensor fascia was then divided longitudinally to proceed with a direct anterior approach to the hip.  We identified and cauterized the circumflex vessels.  I then identified the hip capsule and identified the hip capsule in a tight  format, finding a moderate joint effusion and significant osteoarthritis of his right hip.  We placed a Cobra retractor on the medial and lateral femoral neck and then made our femoral neck cut with an oscillating saw and completed this with an  osteotome.  We placed a corkscrew guide in the femoral head and removed the femoral head in its entirety and found a large area devoid of cartilage.  We then placed a bent Hohmann over the medial acetabular rim and removed remnants of the acetabular  labrum and other debris.  We then began reaming from a size 43 reamer under direct visualization all the way up to a size 51 with all reamers under direct visualization, the last  reamer under direct fluoroscopy.  So, we did standard depth of reaming  on  inclination anteversion.  Once we were pleased with this, we placed the real DePuy Sector GRIPTION acetabular component size 52 and a 36+0 neutral polyethylene liner for that size acetabular component.  Attention was then turned to the femur.  With the  leg is externally rotated at 120  degrees, we extended and adducted.  We were able to place a Mueller retractor medially, now returned to the greater trochanter, released it.  We used a box cutting osteotome to enter the femoral canal and a rongeur to  lateralize and then began broaching from a size 8 broaching and ____ to a size 11.  With a size 11 in place, we trialed a varus offset femoral neck and a 36+1 5 hip ball reducing the acetabulum.  We were pleased with range of motion and stability offset  and leg length.  We then irrigated the soft tissues with normal saline solution and dislocated the hip.  We removed the trial components and then placed the real Corail femoral component size 11 with a varus offset and the real 36+1.5 ceramic head ball  reducing the acetabulum.  Again, we appreciated the stability of it.  We then closed the joint capsule with interrupted #1 Ethibond suture, followed by running #1 Vicryl over the tensor fascia, 0 Vicryl in the deep tissues, 2-0 Vicryl, subcutaneous  tissue, 4-0 Monocryl subcuticular stitch and Steri-Strips on the skin.  An Aquacel dressing was applied.    He was taken off the Hana table and taken to recovery room in stable condition.  All final counts were correct.  There were no complications noted.    Of note, Rexene Edison, PA-C assisted entire case, his assistance was crucial for facilitating all aspects of this case.  AN/NUANCE  D:07/10/2017 T:07/10/2017 JOB:[id number]

## 2017-07-10 NOTE — H&P (Signed)
TOTAL HIP ADMISSION H&P  Patient is admitted for right total hip arthroplasty.  Subjective:  Chief Complaint: right hip pain  HPI: Keith Vaughn, 57 y.o. male, has a history of pain and functional disability in the right hip(s) due to arthritis and patient has failed non-surgical conservative treatments for greater than 12 weeks to include NSAID's and/or analgesics, corticosteriod injections, flexibility and strengthening excercises and activity modification.  Onset of symptoms was gradual starting 2 years ago with gradually worsening course since that time.The patient noted no past surgery on the right hip(s).  Patient currently rates pain in the right hip at 10 out of 10 with activity. Patient has night pain, worsening of pain with activity and weight bearing, pain that interfers with activities of daily living and pain with passive range of motion. Patient has evidence of subchondral sclerosis, periarticular osteophytes and joint space narrowing by imaging studies. This condition presents safety issues increasing the risk of falls.  There is no current active infection.  Patient Active Problem List   Diagnosis Date Noted  . Unilateral primary osteoarthritis, right hip 07/10/2017  . Osteoarthritis of left hip 11/16/2015  . Status post left hip replacement 11/16/2015   Past Medical History:  Diagnosis Date  . Arthritis   . Diabetes mellitus without complication [facility])     Past Surgical History:  Procedure Laterality Date  . HAND SURGERY Right    bolts and screws  . TOTAL HIP ARTHROPLASTY Left 11/16/2015   Procedure: LEFT TOTAL HIP ARTHROPLASTY ANTERIOR APPROACH;  Surgeon: Kathryne Hitch, MD;  Location: [facility] OR;  Service: Orthopedics;  Laterality: Left;    Current Facility-Administered Medications  Medication Dose Route Frequency Provider Last Rate Last Dose  . ceFAZolin (ANCEF) IVPB 2g/100 mL premix  2 g Intravenous To SS-Surg Kathryne Hitch, MD      . chlorhexidine  (HIBICLENS) 4 % liquid 4 application  60 mL Topical Once Kirtland Bouchard, PA-C      . lactated ringers infusion   Intravenous Continuous Shelton Silvas, MD 10 mL/hr at 07/10/17 1117    . tranexamic acid (CYKLOKAPRON) 1,000 mg in sodium chloride 0.9 % 100 mL IVPB  1,000 mg Intravenous To OR Kathryne Hitch, MD       No Known Allergies  Social History   Tobacco Use  . Smoking status: Current Every Day Smoker    Packs/day: 1.00    Types: Cigarettes  . Smokeless tobacco: Never Used  . Tobacco comment: onlt 3 cigarettes  a day  Substance Use Topics  . Alcohol use: No    Comment: quit 5 years ago    History reviewed. No pertinent family history.   Review of Systems  Musculoskeletal: Positive for joint pain.  All other systems reviewed and are negative.   Objective:  Physical Exam  Constitutional: He is oriented to person, place, and time. He appears well-developed and well-nourished.  HENT:  Head: Normocephalic and atraumatic.  Eyes: Pupils are equal, round, and reactive to light. EOM are normal.  Neck: Normal range of motion. Neck supple.  Cardiovascular: Normal rate and regular rhythm.  Respiratory: Effort normal and breath sounds normal.  GI: Soft. Bowel sounds are normal.  Musculoskeletal:       Right hip: He exhibits decreased range of motion, decreased strength, tenderness and bony tenderness.  Neurological: He is alert and oriented to person, place, and time.  Skin: Skin is warm and dry.  Psychiatric: He has a normal mood and affect.  Vital signs in last 24 hours: Temp:  [98 F (36.7 C)] 98 F (36.7 C) (05/07 1052) Pulse Rate:  [63] 63 (05/07 1052) Resp:  [18] 18 (05/07 1052) BP: (156)/(85) 156/85 (05/07 1052) SpO2:  [100 %] 100 % (05/07 1052) Weight:  [160 lb 1.6 oz (72.6 kg)] 160 lb 1.6 oz (72.6 kg) (05/07 1052)  Labs:   Estimated body mass index is 25.08 kg/m as calculated from the following:   Height as of this encounter:  (1.702 m).    Weight as of this encounter: 160 lb 1.6 oz (72.6 kg).   Imaging Review Plain radiographs demonstrate severe degenerative joint disease of the right hip(s). The bone quality appears to be excellent for age and reported activity level.    Preoperative templating of the joint replacement has been completed, documented, and submitted to the Operating Room personnel in order to optimize intra-operative equipment management    Assessment/Plan:  End stage arthritis, right hip(s)  The patient history, physical examination, clinical judgement of the provider and imaging studies are consistent with end stage degenerative joint disease of the right hip(s) and total hip arthroplasty is deemed medically necessary. The treatment options including medical management, injection therapy, arthroscopy and arthroplasty were discussed at length. The risks and benefits of total hip arthroplasty were presented and reviewed. The risks due to aseptic loosening, infection, stiffness, dislocation/subluxation,  thromboembolic complications and other imponderables were discussed.  The patient acknowledged the explanation, agreed to proceed with the plan and consent was signed. Patient is being admitted for inpatient treatment for surgery, pain control, PT, OT, prophylactic antibiotics, VTE prophylaxis, progressive ambulation and ADL's and discharge planning.The patient is planning to be discharged home with home health services

## 2017-07-10 NOTE — Anesthesia Preprocedure Evaluation (Addendum)
Anesthesia Evaluation  Patient identified by MRN, date of birth, ID band Patient awake    Reviewed: Allergy & Precautions, NPO status , Patient's Chart, lab work & pertinent test results  Airway Mallampati: II  TM Distance: >3 FB Neck ROM: Full    Dental  (+) Missing,    Pulmonary Current Smoker,    Pulmonary exam normal breath sounds clear to auscultation       Cardiovascular Exercise Tolerance: Good negative cardio ROS Normal cardiovascular exam Rhythm:Regular Rate:Normal     Neuro/Psych negative neurological ROS  negative psych ROS   GI/Hepatic negative GI ROS, Neg liver ROS,   Endo/Other  diabetes, Type 2, Oral Hypoglycemic Agents  Renal/GU negative Renal ROS  negative genitourinary   Musculoskeletal  (+) Arthritis , Osteoarthritis,    Abdominal   Peds  Hematology negative hematology ROS (+)   Anesthesia Other Findings   Reproductive/Obstetrics                          Lab Results  Component Value Date   WBC 5.4 07/02/2017   HGB 14.3 07/02/2017   HCT 43.1 07/02/2017   MCV 85.5 07/02/2017   PLT 217 07/02/2017   Lab Results  Component Value Date   CREATININE 1.28 (H) 07/02/2017   BUN 13 07/02/2017   NA 142 07/02/2017   K 4.1 07/02/2017   CL 105 07/02/2017   CO2 31 07/02/2017   No results found for: INR, PROTIME  EKG: sinus bradycardia.  Anesthesia Physical Anesthesia Plan  ASA: II  Anesthesia Plan: Spinal   Post-op Pain Management:    Induction: Intravenous  PONV Risk Score and Plan: 2 and Treatment may vary due to age or medical condition, Ondansetron and Propofol infusion  Airway Management Planned: Simple Face Mask  Additional Equipment: None  Intra-op Plan:   Post-operative Plan:   Informed Consent: I have reviewed the patients History and Physical, chart, labs and discussed the procedure including the risks, benefits and alternatives for the proposed  anesthesia with the patient or authorized representative who has indicated his/her understanding and acceptance.   Dental advisory given  Plan Discussed with: CRNA and Anesthesiologist  Anesthesia Plan Comments:      Anesthesia Quick Evaluation

## 2017-07-11 ENCOUNTER — Encounter (HOSPITAL_COMMUNITY): Payer: Self-pay | Admitting: General Practice

## 2017-07-11 ENCOUNTER — Other Ambulatory Visit: Payer: Self-pay

## 2017-07-11 LAB — BASIC METABOLIC PANEL
ANION GAP: 6 (ref 5–15)
BUN: 13 mg/dL (ref 6–20)
CHLORIDE: 105 mmol/L (ref 101–111)
CO2: 29 mmol/L (ref 22–32)
Calcium: 8.8 mg/dL — ABNORMAL LOW (ref 8.9–10.3)
Creatinine, Ser: 1.26 mg/dL — ABNORMAL HIGH (ref 0.61–1.24)
GFR calc Af Amer: >60 mL/min (ref >60)
GLUCOSE: 125 mg/dL — AB (ref 65–99)
Potassium: 4.5 mmol/L (ref 3.5–5.1)
SODIUM: 140 mmol/L (ref 135–145)

## 2017-07-11 LAB — CBC
HCT: 37.9 % — ABNORMAL LOW (ref 39.0–52.0)
HEMOGLOBIN: 12.7 g/dL — AB (ref 13.0–17.0)
MCH: 28 pg (ref 26.0–34.0)
MCHC: 33.5 g/dL (ref 30.0–36.0)
MCV: 83.7 fL (ref 78.0–100.0)
Platelets: 210 10*3/uL (ref 150–400)
RBC: 4.53 MIL/uL (ref 4.22–5.81)
RDW: 13.1 % (ref 11.5–15.5)
WBC: 9 10*3/uL (ref 4.0–10.5)

## 2017-07-11 LAB — GLUCOSE, CAPILLARY: GLUCOSE-CAPILLARY: 114 mg/dL — AB (ref 65–99)

## 2017-07-11 NOTE — Evaluation (Signed)
Occupational Therapy Evaluation Patient Details Name: Keith Vaughn MRN: [id number] DOB: January 31, 1961 Today's Date: 07/11/2017    History of Present Illness Pt is a 57 y.o. male s/p R total hip arthroplasty through direct anterior approach. PMH significant for arthritis, diabetes mullitus without complication, R hand surgery, and L anterior total hip arthroplasty.    Clinical Impression   PTA, pt was independent with ADL and functional mobility. He was hesitant to participate today secondary to pain but willing to work with OT. Pt able to complete simulated toilet transfer with min assist and maximum cues for safe use of RW. Pt was impulsive throughout session and demonstrated decreased adherence to safety recommendations. He requires max assist for LB ADL at this time. Feel pt will make good progress with acute OT and do not anticipate need for further OT follow-up post-acute D/C. Will continue to follow while admitted.     Follow Up Recommendations  No OT follow up;Supervision/Assistance - 24 hour    Equipment Recommendations  None recommended by OT    Recommendations for Other Services       Precautions / Restrictions Precautions Precautions: None Restrictions Weight Bearing Restrictions: Yes RLE Weight Bearing: Weight bearing as tolerated      Mobility Bed Mobility Overal bed mobility: Needs Assistance Bed Mobility: Supine to Sit     Supine to sit: Min guard     General bed mobility comments: Cues for safety throughout but minimal ability to attend to this.   Transfers Overall transfer level: Needs assistance Equipment used: Rolling walker (2 wheeled) Transfers: Sit to/from Stand Sit to Stand: Min assist         General transfer comment: Min assist to power up to standing.     Balance Overall balance assessment: Needs assistance Sitting-balance support: Feet supported;Bilateral upper extremity supported Sitting balance-Leahy Scale: Poor     Standing balance  support: Bilateral upper extremity supported;During functional activity Standing balance-Leahy Scale: Poor Standing balance comment: Relies on UE support.                            ADL either performed or assessed with clinical judgement   ADL Overall ADL's : Needs assistance/impaired Eating/Feeding: Set up;Sitting   Grooming: Set up;Sitting   Upper Body Bathing: Set up;Sitting   Lower Body Bathing: Maximal assistance;Sit to/from stand   Upper Body Dressing : Set up;Sitting   Lower Body Dressing: Maximal assistance;Sit to/from stand   Toilet Transfer: Minimal assistance;Ambulation;Cueing for safety;RW Toilet Transfer Details (indicate cue type and reason): Assist to power up to standing. Min assist for stability with cues for safety and use of RW.  Toileting- Clothing Manipulation and Hygiene: Moderate assistance;Sit to/from stand       Functional mobility during ADLs: Minimal assistance;Rolling walker General ADL Comments: Limited activity tolerance for ADL. Poor ability to safely use RW and did not attend to directions.      Vision Patient Visual Report: No change from baseline Vision Assessment?: No apparent visual deficits     Perception     Praxis      Pertinent Vitals/Pain Pain Assessment: Faces Faces Pain Scale: Hurts little more Pain Location: R hip Pain Descriptors / Indicators: Discomfort;Grimacing;Operative site guarding;Sore Pain Intervention(s): Limited activity within patient's tolerance;Monitored during session;Repositioned     Hand Dominance     Extremity/Trunk Assessment Upper Extremity Assessment Upper Extremity Assessment: Overall WFL for tasks assessed   Lower Extremity Assessment Lower Extremity Assessment: RLE deficits/detail  RLE Deficits / Details: decreased strength and ROM as expected post-operatively       Communication Communication Communication: No difficulties   Cognition Arousal/Alertness: Awake/alert Behavior  During Therapy: Impulsive Overall Cognitive Status: Within Functional Limits for tasks assessed                                 General Comments: Impulsive throughout session. Pt requiring cues to answer questions due to rushing to answer without allowing time for question to be completed.    General Comments  Pt hesitant to participate.     Exercises     Shoulder Instructions      Home Living Family/patient expects to be discharged to:: Private residence Living Arrangements: Alone Available Help at Discharge: Family(sisters) Type of Home: Apartment Home Access: Stairs to enter [employer] of Steps: 3 Entrance Stairs-Rails: Can reach both Home Layout: One level     Bathroom Shower/Tub: Chief Strategy Officer: Standard     Home Equipment: Environmental consultant - 2 wheels;Shower seat;Bedside commode          Prior Functioning/Environment Level of Independence: Independent        Comments: Doesn't drive.        OT Problem List: Decreased strength;Decreased range of motion;Decreased activity tolerance;Impaired balance (sitting and/or standing);Decreased safety awareness;Decreased knowledge of use of DME or AE;Decreased knowledge of precautions;Pain      OT Treatment/Interventions: Self-care/ADL training;Therapeutic exercise;Energy conservation;DME and/or AE instruction;Therapeutic activities;Patient/family education;Balance training    OT Goals(Current goals can be found in the care plan section) Acute Rehab OT Goals Patient Stated Goal: less pain OT Goal Formulation: With patient Time For Goal Achievement: 07/25/17 Potential to Achieve Goals: Good ADL Goals Pt Will Perform Grooming: with modified independence;standing Pt Will Perform Lower Body Bathing: with modified independence;sit to/from stand Pt Will Perform Lower Body Dressing: with modified independence;sit to/from stand Pt Will Transfer to Toilet: with modified  independence;ambulating;bedside commode(BSC over toilet) Pt Will Perform Toileting - Clothing Manipulation and hygiene: with modified independence;sit to/from stand Pt Will Perform Tub/Shower Transfer: with modified independence;shower seat;rolling walker;Tub transfer  OT Frequency: Min 2X/week   Barriers to D/C:            Co-evaluation              AM-PAC PT "6 Clicks" Daily Activity     Outcome Measure Help from another person eating meals?: None Help from another person taking care of personal grooming?: None(sitting) Help from another person toileting, which includes using toliet, bedpan, or urinal?: A Little Help from another person bathing (including washing, rinsing, drying)?: A Lot Help from another person to put on and taking off regular upper body clothing?: A Little Help from another person to put on and taking off regular lower body clothing?: A Lot 6 Click Score: 18   End of Session Equipment Utilized During Treatment: Rolling walker Nurse Communication: Mobility status  Activity Tolerance: Patient tolerated treatment well Patient left: in chair;with call bell/phone within reach  OT Visit Diagnosis: Other abnormalities of gait and mobility (R26.89);Pain Pain - Right/Left: Right Pain - part of body: Hip                Time: 1610-9604 OT Time Calculation (min): 14 min Charges:  OT General Charges $OT Visit: 1 Visit OT Evaluation $OT Eval Moderate Complexity: 1 Mod G-Codes:     Doristine Section, MS OTR/L  Pager: [phone]  Reshanda Lewey A Ryelynn Guedea 07/11/2017, 1:29 PM

## 2017-07-11 NOTE — Progress Notes (Signed)
Physical Therapy Treatment Patient Details Name: Keith Vaughn MRN: [id number] DOB: [birth year] Today's Date: 07/11/2017    History of Present Illness Pt is a 57 y.o. male s/p R total hip arthroplasty through direct anterior approach. PMH significant for arthritis, diabetes mullitus without complication, R hand surgery, and L anterior total hip arthroplasty.     PT Comments    Patient progressing slowly towards goals. Increase in ambulation distance to 45 feet with RW and increased RLE weightbearing. Patient still demonstrating very unsafe transfers. Essentially, he utilizes RLE nonweightbearing and leans completely laterally onto the left side to transition from sit to stand and from standing to sitting. Patient is impulsive and does not follow instructions/safety cueing consistently. Will need stair training prior to discharge.   Follow Up Recommendations  Follow surgeon's recommendation for DC plan and follow-up therapies     Equipment Recommendations  Other (comment);3in1 (PT)(pt has RW)    Recommendations for Other Services       Precautions / Restrictions Precautions Precautions: None Restrictions Weight Bearing Restrictions: Yes RLE Weight Bearing: Weight bearing as tolerated    Mobility  Bed Mobility Overal bed mobility: Needs Assistance Bed Mobility: Sit to Supine     Supine to sit: Min assist     General bed mobility comments: Requiring min assist for RLE management  Transfers Overall transfer level: Needs assistance Equipment used: Rolling walker (2 wheeled) Transfers: Sit to/from Stand Sit to Stand: Min guard         General transfer comment: Patient demonstrating unsafe transfers. Transitioning from sitting/standing with essentially RLE NWB and leaning laterally.  Ambulation/Gait Ambulation/Gait assistance: Min guard Ambulation Distance (Feet): 45 Feet Assistive device: Rolling walker (2 wheeled) Gait Pattern/deviations: Step-to pattern;Decreased  weight shift to right;Narrow base of support     General Gait Details: Patient shows increased RLE weightbearing but still utilizing step to pattern. VC's for wider BOS.   Stairs             Wheelchair Mobility    Modified Rankin (Stroke Patients Only)       Balance Overall balance assessment: Needs assistance Sitting-balance support: Feet supported;Bilateral upper extremity supported Sitting balance-Leahy Scale: Good     Standing balance support: Bilateral upper extremity supported;During functional activity Standing balance-Leahy Scale: Poor Standing balance comment: Relies on UE support.                             Cognition Arousal/Alertness: Awake/alert Behavior During Therapy: Impulsive Overall Cognitive Status: Within Functional Limits for tasks assessed                                 General Comments: Impulsive throughout session. Stood from Medical illustrator before set up.      Exercises      General Comments        Pertinent Vitals/Pain Pain Assessment: Faces Faces Pain Scale: Hurts even more Pain Location: R hip Pain Descriptors / Indicators: Discomfort;Grimacing;Operative site guarding;Sore Pain Intervention(s): Limited activity within patient's tolerance;Monitored during session;Patient requesting pain meds-RN notified    Home Living                      Prior Function            PT Goals (current goals can now be found in the care plan section) Acute Rehab PT Goals Patient Stated Goal: less pain  PT Goal Formulation: With patient Time For Goal Achievement: 07/16/17 Potential to Achieve Goals: Good Progress towards PT goals: Progressing toward goals    Frequency    7X/week      PT Plan Current plan remains appropriate    Co-evaluation              AM-PAC PT "6 Clicks" Daily Activity  Outcome Measure  Difficulty turning over in bed (including adjusting bedclothes, sheets and blankets)?: A  Lot Difficulty moving from lying on back to sitting on the side of the bed? : Unable Difficulty sitting down on and standing up from a chair with arms (e.g., wheelchair, bedside commode, etc,.)?: A Little Help needed moving to and from a bed to chair (including a wheelchair)?: A Little Help needed walking in hospital room?: A Little Help needed climbing 3-5 steps with a railing? : A Lot 6 Click Score: 14    End of Session Equipment Utilized During Treatment: Gait belt Activity Tolerance: Patient limited by pain Patient left: in bed;with call bell/phone within reach Nurse Communication: Mobility status PT Visit Diagnosis: Unsteadiness on feet (R26.81);Pain;Difficulty in walking, not elsewhere classified (R26.2) Pain - Right/Left: Right Pain - part of body: Hip     Time: 2130-8657 PT Time Calculation (min) (ACUTE ONLY): 21 min  Charges:  $Gait Training: 8-22 mins                    G Codes:      Laurina Bustle, PT, DPT Acute Rehabilitation Services  Pager: [phone]  [name] 07/11/2017, 5:21 PM

## 2017-07-11 NOTE — Progress Notes (Signed)
PT Cancellation Note  Patient Details Name: Keith Vaughn MRN: [id number] DOB: October 07, 1960   Cancelled Treatment:    Reason Eval/Treat Not Completed: Pain limiting ability to participate. Patient refusing therapy secondary to pain. States he previously tried to get up with nurse tech but could not. Patient asking PT to come back tomorrow but ultimately agreed for PT to check in on later today.   Laurina Bustle, PT, DPT Acute Rehabilitation Services  Pager: [phone]    [name] 07/11/2017, 7:54 AM

## 2017-07-11 NOTE — Progress Notes (Signed)
Subjective: 1 Day Post-Op Procedure(s) (LRB): RIGHT TOTAL HIP ARTHROPLASTY ANTERIOR APPROACH (Right) Patient reports pain as moderate.    Objective: Vital signs in last 24 hours: Temp:  [97.5 F (36.4 C)-98.4 F (36.9 C)] 98.2 F (36.8 C) (05/08 0315) Pulse Rate:  [46-72] 63 (05/08 0315) Resp:  [10-22] 16 (05/08 0315) BP: (94-169)/(62-91) 125/82 (05/08 0315) SpO2:  [99 %-100 %] 100 % (05/08 0315) Weight:  [160 lb 1.6 oz (72.6 kg)] 160 lb 1.6 oz (72.6 kg) (05/07 1052)  Intake/Output from previous day: 05/07 0701 - 05/08 0700 In: 1210 [I.V.:1000; IV Piggyback:210] Out: 750 [Urine:600; Blood:150] Intake/Output this shift: Total I/O In: -  Out: 400 [Urine:400]  Recent Labs    07/11/17 0308  HGB 12.7*   Recent Labs    07/11/17 0308  WBC 9.0  RBC 4.53  HCT 37.9*  PLT 210   Recent Labs    07/11/17 0308  NA 140  K 4.5  CL 105  CO2 29  BUN 13  CREATININE 1.26*  GLUCOSE 125*  CALCIUM 8.8*   No results for input(s): LABPT, INR in the last 72 hours.  Sensation intact distally Intact pulses distally Dorsiflexion/Plantar flexion intact Incision: dressing C/D/I  Assessment/Plan: 1 Day Post-Op Procedure(s) (LRB): RIGHT TOTAL HIP ARTHROPLASTY ANTERIOR APPROACH (Right) Up with therapy    Kathryne Hitch 07/11/2017, 6:56 AM

## 2017-07-11 NOTE — Anesthesia Postprocedure Evaluation (Signed)
Anesthesia Post Note  Patient: Keith Vaughn  Procedure(s) Performed: RIGHT TOTAL HIP ARTHROPLASTY ANTERIOR APPROACH (Right Hip)     Patient location during evaluation: PACU Anesthesia Type: General Level of consciousness: oriented and awake and alert Pain management: pain level controlled Vital Signs Assessment: post-procedure vital signs reviewed and stable Respiratory status: spontaneous breathing, respiratory function stable and patient connected to nasal cannula oxygen Cardiovascular status: blood pressure returned to baseline and stable Postop Assessment: no headache, no backache and no apparent nausea or vomiting Anesthetic complications: no    Last Vitals:  Vitals:   07/11/17 0010 07/11/17 0315  BP: (!) 145/80 125/82  Pulse: 72 63  Resp: 16 16  Temp: 36.9 C 36.8 C  SpO2: 100% 100%                 Shelton Silvas

## 2017-07-11 NOTE — Evaluation (Signed)
Physical Therapy Evaluation Patient Details Name: Keith Vaughn MRN: [id number] DOB: 02/19/1961 Today's Date: 07/11/2017   History of Present Illness  Pt is a 57 y.o. male s/p R total hip arthroplasty through direct anterior approach. PMH significant for arthritis, diabetes mellitus without complication, R hand surgery, and L anterior total hip arthroplasty.   Clinical Impression  Pt is s/p TKA resulting in the deficits listed below (see PT Problem List). Patient requires increased encouragement to participate and is self limiting. Walking in room ~10 feet with RW and the RLE in touchdown weight bearing (although patient is WBAT). Refused further ambulation due to pain. Pt will benefit from skilled PT to increase their independence and safety with mobility. Will progress gait training, stair training, and strengthening/ROM.     Follow Up Recommendations Follow surgeon's recommendation for DC plan and follow-up therapies    Equipment Recommendations  Other (comment);3in1 (PT)(pt has RW)    Recommendations for Other Services       Precautions / Restrictions Precautions Precautions: None Restrictions Weight Bearing Restrictions: Yes RLE Weight Bearing: Weight bearing as tolerated      Mobility  Bed Mobility Overal bed mobility: Needs Assistance Bed Mobility: Sit to Supine     Supine to sit: Min assist     General bed mobility comments: Patient requiring assistance for RLE elevation onto bed. Think patient has strength to do it independently, however, he refused to attempt.  Transfers Overall transfer level: Needs assistance Equipment used: Rolling walker (2 wheeled) Transfers: Sit to/from Stand Sit to Stand: Min guard         General transfer comment: Patient wtih left lateral lean from sit to stand to avoid RLE weightbearing  Ambulation/Gait Ambulation/Gait assistance: Min guard Ambulation Distance (Feet): 10 Feet Assistive device: Rolling walker (2 wheeled) Gait  Pattern/deviations: Step-to pattern;Decreased weight shift to right;Narrow base of support     General Gait Details: Patient essentially performing touch down weightbearing on RLE. VC's for sequencing but patient did not perform.  Stairs            Wheelchair Mobility    Modified Rankin (Stroke Patients Only)       Balance Overall balance assessment: Needs assistance Sitting-balance support: Feet supported;Bilateral upper extremity supported Sitting balance-Leahy Scale: Good     Standing balance support: Bilateral upper extremity supported;During functional activity Standing balance-Leahy Scale: Poor Standing balance comment: Relies on UE support.                              Pertinent Vitals/Pain Pain Assessment: Faces Faces Pain Scale: Hurts little more Pain Location: R hip Pain Descriptors / Indicators: Discomfort;Grimacing;Operative site guarding;Sore Pain Intervention(s): Limited activity within patient's tolerance;Monitored during session;Repositioned    Home Living Family/patient expects to be discharged to:: Private residence Living Arrangements: Alone Available Help at Discharge: Family(sisters) Type of Home: Apartment Home Access: Stairs to enter Entrance Stairs-Rails: Can reach both Entrance Stairs-Number of Steps: 3 Home Layout: One level Home Equipment: Walker - 2 wheels;Shower seat;Bedside commode      Prior Function Level of Independence: Independent         Comments: Doesn't drive,      Hand Dominance        Extremity/Trunk Assessment   Upper Extremity Assessment Upper Extremity Assessment: Overall WFL for tasks assessed    Lower Extremity Assessment Lower Extremity Assessment: RLE deficits/detail RLE Deficits / Details: decreased strength and ROM as expected post-operatively. Able to  perform limited heel slide and quad set. Ankle dorsiflexion/plantarflexion WFL       Communication   Communication: No difficulties   Cognition Arousal/Alertness: Awake/alert Behavior During Therapy: Impulsive Overall Cognitive Status: Within Functional Limits for tasks assessed                                 General Comments: Impulsive throughout session. Stood from Medical illustrator before set up.      General Comments      Exercises     Assessment/Plan    PT Assessment Patient needs continued PT services  PT Problem List Decreased strength;Decreased range of motion;Decreased activity tolerance;Decreased balance;Decreased mobility;Decreased coordination;Pain       PT Treatment Interventions DME instruction;Gait training;Functional mobility training;Stair training;Therapeutic activities;Therapeutic exercise;Balance training;Patient/family education    PT Goals (Current goals can be found in the Care Plan section)  Acute Rehab PT Goals Patient Stated Goal: less pain PT Goal Formulation: With patient Time For Goal Achievement: 07/16/17 Potential to Achieve Goals: Good    Frequency 7X/week   Barriers to discharge        Co-evaluation               AM-PAC PT "6 Clicks" Daily Activity  Outcome Measure Difficulty turning over in bed (including adjusting bedclothes, sheets and blankets)?: A Lot Difficulty moving from lying on back to sitting on the side of the bed? : Unable Difficulty sitting down on and standing up from a chair with arms (e.g., wheelchair, bedside commode, etc,.)?: A Little Help needed moving to and from a bed to chair (including a wheelchair)?: A Little Help needed walking in hospital room?: A Little Help needed climbing 3-5 steps with a railing? : A Lot 6 Click Score: 14    End of Session Equipment Utilized During Treatment: Gait belt Activity Tolerance: Patient limited by pain Patient left: in bed;with call bell/phone within reach Nurse Communication: Mobility status PT Visit Diagnosis: Unsteadiness on feet (R26.81);Pain;Difficulty in walking, not elsewhere  classified (R26.2) Pain - Right/Left: Right Pain - part of body: Hip    Time: 1102-1120 PT Time Calculation (min) (ACUTE ONLY): 18 min   Charges:   PT Evaluation $PT Eval Moderate Complexity: 1 Mod     PT G Codes:       Laurina Bustle, PT, DPT Acute Rehabilitation Services  Pager: [phone]   [name] 07/11/2017, 1:49 PM

## 2017-07-12 MED ORDER — METHOCARBAMOL 500 MG PO TABS: 500.0000 mg | ORAL_TABLET | Freq: Four times a day (QID) | ORAL | 1 refills | Status: DC | PRN

## 2017-07-12 MED ORDER — ASPIRIN 81 MG PO CHEW: 81.0000 mg | CHEWABLE_TABLET | Freq: Two times a day (BID) | ORAL | 0 refills | Status: DC

## 2017-07-12 MED ORDER — OXYCODONE HCL 5 MG PO TABS: 5.0000 mg | ORAL_TABLET | ORAL | 0 refills | Status: DC | PRN

## 2017-07-12 NOTE — Care Management Note (Signed)
Case Management Note  Patient Details  Name: Keith Vaughn MRN: [id number] Date of Birth: 1960-07-12  Subjective/Objective:   57 yr old male s/p right total hip arthroplasty.                 Action/Plan: Case manager spoke with patient concerning discharge plan. Patient was preoperatively setup with [facility], no changes. Patient says he has a support system but lives alone.    Expected Discharge Date:    07/12/17              Expected Discharge Plan:  Home w Home Health Services  In-House Referral:  NA  Discharge planning Services  CM Consult  Post Acute Care Choice:  Home Health Choice offered to:  Patient  DME Arranged:  (has RW and 3in1) DME Agency:  NA  HH Arranged:  PT HH Agency:  [facility] (formerly [facility])  Status of Service:  Completed, signed off  If discussed at [employer], dates discussed:    Additional Comments:  Durenda Guthrie, RN 07/12/2017, 10:50 AM

## 2017-07-12 NOTE — Progress Notes (Signed)
Subjective: 2 Days Post-Op Procedure(s) (LRB): RIGHT TOTAL HIP ARTHROPLASTY ANTERIOR APPROACH (Right) Patient reports pain as moderate.  No complaints oterwise.   Objective: Vital signs in last 24 hours: Temp:  [98.8 F (37.1 C)-100.1 F (37.8 C)] 98.8 F (37.1 C) (05/09 0406) Pulse Rate:  [79-81] 81 (05/09 0406) Resp:  [16-18] 18 (05/09 0406) BP: (121-131)/(51-81) 131/81 (05/09 0406) SpO2:  [98 %-99 %] 98 % (05/09 0406)  Intake/Output from previous day: 05/08 0701 - 05/09 0700 In: 720 [P.O.:720] Out: 1300 [Urine:1300] Intake/Output this shift: Total I/O In: -  Out: 200 [Urine:200]  Recent Labs    07/11/17 0308  HGB 12.7*   Recent Labs    07/11/17 0308  WBC 9.0  RBC 4.53  HCT 37.9*  PLT 210   Recent Labs    07/11/17 0308  NA 140  K 4.5  CL 105  CO2 29  BUN 13  CREATININE 1.26*  GLUCOSE 125*  CALCIUM 8.8*   No results for input(s): LABPT, INR in the last 72 hours.  Right lower extremity: Intact pulses distally Dorsiflexion/Plantar flexion intact Incision: scant drainage Compartment soft  Assessment/Plan: 2 Days Post-Op Procedure(s) (LRB): RIGHT TOTAL HIP ARTHROPLASTY ANTERIOR APPROACH (Right) Up with therapy Discharge home with home health tomorrow    Richardean Canal 07/12/2017, 10:01 AM

## 2017-07-12 NOTE — Progress Notes (Signed)
Occupational Therapy Treatment Patient Details Name: Keith Vaughn MRN: [id number] DOB: 12-27-1960 Today's Date: 07/12/2017    History of present illness Pt is a 57 y.o. male s/p R total hip arthroplasty through direct anterior approach. PMH significant for arthritis, diabetes mullitus without complication, R hand surgery, and L anterior total hip arthroplasty.    OT comments  AE issued.  Education provided.  Pt states she will have A at home  Follow Up Recommendations  No OT follow up;Supervision/Assistance - 24 hour    Equipment Recommendations  None recommended by OT    Recommendations for Other Services      Precautions / Restrictions Precautions Precautions: None Restrictions Weight Bearing Restrictions: Yes RLE Weight Bearing: Weight bearing as tolerated       Mobility Bed Mobility Overal bed mobility: Needs Assistance Bed Mobility: Sit to Supine     Supine to sit: Supervision Sit to supine: Min assist   General bed mobility comments: VC for sequencing  Transfers Overall transfer level: Needs assistance Equipment used: Rolling walker (2 wheeled) Transfers: Sit to/from [employer] Sit to Stand: Supervision Stand pivot transfers: Min guard       General transfer comment: Cues for hand placement.  Pt pulls on RW despite cues.      Balance Overall balance assessment: Needs assistance   Sitting balance-Leahy Scale: Good       Standing balance-Leahy Scale: Poor Standing balance comment: Relies on UE support.                            ADL either performed or assessed with clinical judgement   ADL                       Lower Body Dressing: Minimal assistance;Sit to/from stand;Cueing for sequencing;Cueing for safety;With adaptive equipment   Toilet Transfer: Min guard;RW;Comfort height toilet;Ambulation   Toileting- Clothing Manipulation and Hygiene: Supervision/safety;Sit to/from stand;Cueing for sequencing;Cueing  for safety       Functional mobility during ADLs: Min guard General ADL Comments: Education provided regarding ADL activity and AE with LB dressing.  AE issued as pt is medicaid.       Vision Patient Visual Report: No change from baseline            Cognition Arousal/Alertness: Awake/alert Behavior During Therapy: Impulsive Overall Cognitive Status: Within Functional Limits for tasks assessed                                 General Comments: Remains impulsive.                General Comments      Pertinent Vitals/ Pain       Pain Assessment: 0-10 Pain Score: 5  Pain Location: R hip Pain Descriptors / Indicators: Discomfort;Grimacing;Operative site guarding;Sore Pain Intervention(s): Limited activity within patient's tolerance;Monitored during session;Repositioned;Ice applied         Frequency  Min 2X/week        Progress Toward Goals  OT Goals(current goals can now be found in the care plan section)  Progress towards OT goals: Progressing toward goals  Acute Rehab OT Goals Patient Stated Goal: less pain  Plan Discharge plan remains appropriate    Co-evaluation                 AM-PAC PT "6 Clicks" Daily Activity  Outcome Measure   Help from another person eating meals?: None Help from another person taking care of personal grooming?: None(sitting) Help from another person toileting, which includes using toliet, bedpan, or urinal?: A Little Help from another person bathing (including washing, rinsing, drying)?: A Little Help from another person to put on and taking off regular upper body clothing?: A Little Help from another person to put on and taking off regular lower body clothing?: A Little 6 Click Score: 20    End of Session Equipment Utilized During Treatment: Rolling walker  OT Visit Diagnosis: Other abnormalities of gait and mobility (R26.89);Pain Pain - Right/Left: Right Pain - part of body: Hip   Activity  Tolerance Patient tolerated treatment well   Patient Left in chair;with call bell/phone within reach   Nurse Communication Mobility status        Time: 8295-6213 OT Time Calculation (min): 17 min  Charges: OT General Charges $OT Visit: 1 Visit OT Treatments $Self Care/Home Management : 8-22 mins  [place], [place] [phone]   Einar Crow D 07/12/2017, 11:49 AM

## 2017-07-12 NOTE — Progress Notes (Signed)
Pt noted to be up and ambulating around room with walker independently of nursing staff or therapy.  Pt educated to call not fall and persists to get up unassisted.  Pt states "when I need something I need it, I ain't waiting for ya'll."  This nurse reinforced to pt the risks of falling on operative leg.  Pt does not agree with plan.  Marketing executive

## 2017-07-12 NOTE — Progress Notes (Signed)
Physical Therapy Treatment Patient Details Name: Keith Vaughn MRN: [id number] DOB: [birth year] Today's Date: 07/12/2017    History of Present Illness Pt is a 57 y.o. male s/p R total hip arthroplasty through direct anterior approach. PMH significant for arthritis, diabetes mullitus without complication, R hand surgery, and L anterior total hip arthroplasty.     PT Comments    Pt increased ambulation distance from first session, however, continues to be limited by R hip soreness. Pt requiring supervision for safety. Reviewed supine HEP. Will continue to follow acutely to maximize functional mobility independence and safety.   Follow Up Recommendations  Follow surgeon's recommendation for DC plan and follow-up therapies     Equipment Recommendations  3in1 (PT)    Recommendations for Other Services       Precautions / Restrictions Precautions Precautions: None Restrictions Weight Bearing Restrictions: Yes RLE Weight Bearing: Weight bearing as tolerated    Mobility  Bed Mobility Overal bed mobility: Needs Assistance Bed Mobility: Supine to Sit;Sit to Supine     Supine to sit: Supervision Sit to supine: Supervision   General bed mobility comments: Supervision for safety. Use of LLE to advance RLE using hook technique.   Transfers Overall transfer level: Needs assistance Equipment used: Rolling walker (2 wheeled) Transfers: Sit to/from Stand Sit to Stand: Supervision         General transfer comment: Demonstrated correct hand placement in afternoon session. Supervision for safety.   Ambulation/Gait Ambulation/Gait assistance: Supervision Ambulation Distance (Feet): 100 Feet Assistive device: Rolling walker (2 wheeled) Gait Pattern/deviations: Step-to pattern;Decreased weight shift to right;Narrow base of support;Antalgic;Decreased stride length Gait velocity: Decreased  Gait velocity interpretation: <1.31 ft/sec, indicative of household ambulator General Gait  Details: Continues to present with step to pattern secondary to increased R hip soreness. Increased tolerance for gait in afternoon. Verbal cues to push RW vs picking RW up and placing down. Pt attempting gait without use of UE and required safety cues to keep hands on RW during gait.    Stairs             Wheelchair Mobility    Modified Rankin (Stroke Patients Only)       Balance Overall balance assessment: Needs assistance Sitting-balance support: Feet supported;Bilateral upper extremity supported Sitting balance-Leahy Scale: Good     Standing balance support: Bilateral upper extremity supported;During functional activity Standing balance-Leahy Scale: Poor Standing balance comment: Relies on UE support.                             Cognition Arousal/Alertness: Awake/alert Behavior During Therapy: Impulsive Overall Cognitive Status: Within Functional Limits for tasks assessed                                 General Comments: Remains impulsive.       Exercises Total Joint Exercises Ankle Circles/Pumps: AROM;Both;10 reps Quad Sets: AROM;Right;10 reps;Supine    General Comments        Pertinent Vitals/Pain Pain Assessment: Faces Faces Pain Scale: Hurts even more Pain Location: R hip Pain Descriptors / Indicators: Discomfort;Grimacing;Operative site guarding;Sore Pain Intervention(s): Limited activity within patient's tolerance;Monitored during session;Repositioned    Home Living                      Prior Function            PT Goals (current goals  can now be found in the care plan section) Acute Rehab PT Goals PT Goal Formulation: With patient Time For Goal Achievement: 07/16/17 Potential to Achieve Goals: Good Progress towards PT goals: Progressing toward goals    Frequency    7X/week      PT Plan Current plan remains appropriate    Co-evaluation              AM-PAC PT "6 Clicks" Daily Activity   Outcome Measure  Difficulty turning over in bed (including adjusting bedclothes, sheets and blankets)?: A Little Difficulty moving from lying on back to sitting on the side of the bed? : A Lot Difficulty sitting down on and standing up from a chair with arms (e.g., wheelchair, bedside commode, etc,.)?: A Lot Help needed moving to and from a bed to chair (including a wheelchair)?: A Little Help needed walking in hospital room?: A Little Help needed climbing 3-5 steps with a railing? : A Little 6 Click Score: 16    End of Session Equipment Utilized During Treatment: Gait belt Activity Tolerance: Patient limited by pain Patient left: in bed;with call bell/phone within reach Nurse Communication: Mobility status PT Visit Diagnosis: Unsteadiness on feet (R26.81);Pain;Difficulty in walking, not elsewhere classified (R26.2) Pain - Right/Left: Right Pain - part of body: Hip     Time: 1191-4782 PT Time Calculation (min) (ACUTE ONLY): 23 min  Charges:  $Gait Training: 23-37 mins                    G Codes:       Gladys Damme, PT, DPT  Acute Rehabilitation Services  Pager: [phone]    Lehman Prom 07/12/2017, 4:27 PM

## 2017-07-12 NOTE — Discharge Instructions (Signed)

## 2017-07-12 NOTE — Progress Notes (Signed)
Physical Therapy Treatment Patient Details Name: Keith Vaughn MRN: [id number] DOB: 07-Apr-1960 Today's Date: 07/12/2017    History of Present Illness Pt is a 57 y.o. male s/p R total hip arthroplasty through direct anterior approach. PMH significant for arthritis, diabetes mullitus without complication, R hand surgery, and L anterior total hip arthroplasty.     PT Comments    Pt performed gait and functional mobility during session.  Pt reviewed HEP and stair training for safe entry into home.  Pt initially refusing tx but after therapeutic exercise he reports his quad feels better and he can get up and walk.  Will f/u in pm.      Follow Up Recommendations  Follow surgeon's recommendation for DC plan and follow-up therapies     Equipment Recommendations  Other (comment);3in1 (PT)(Pt has RW)    Recommendations for Other Services       Precautions / Restrictions Precautions Precautions: None Restrictions Weight Bearing Restrictions: Yes RLE Weight Bearing: Weight bearing as tolerated    Mobility  Bed Mobility Overal bed mobility: Needs Assistance Bed Mobility: Supine to Sit     Supine to sit: Supervision     General bed mobility comments: Pt required increased time, Used LLE to advance RLE.    Transfers Overall transfer level: Needs assistance Equipment used: Rolling walker (2 wheeled) Transfers: Sit to/from Stand Sit to Stand: Supervision         General transfer comment: Cues for hand placement.  Pt pulls on RW despite cues.    Ambulation/Gait Ambulation/Gait assistance: Min guard Ambulation Distance (Feet): 70 Feet Assistive device: Rolling walker (2 wheeled) Gait Pattern/deviations: Step-to pattern;Decreased weight shift to right;Narrow base of support;Antalgic;Decreased stride length     General Gait Details: Patient shows increased RLE weightbearing but still utilizing step to pattern. VC's for wider BOS. VCs to push RW vs picking up and placing down.      Stairs Stairs: Yes Stairs assistance: Supervision Stair Management: Forwards;Two rails Number of Stairs: 3 General stair comments: Cues for sequencing and hand placement.  Pt performed 4 inch stairs.  Pt with foot sequencing with R side due to pain.     Wheelchair Mobility    Modified Rankin (Stroke Patients Only)       Balance Overall balance assessment: Needs assistance   Sitting balance-Leahy Scale: Good       Standing balance-Leahy Scale: Poor Standing balance comment: Relies on UE support.                             Cognition Arousal/Alertness: Awake/alert Behavior During Therapy: Impulsive Overall Cognitive Status: Within Functional Limits for tasks assessed                                 General Comments: Remains impulsive.        Exercises Total Joint Exercises Ankle Circles/Pumps: AROM;Right;20 reps;Supine Quad Sets: AROM;Right;10 reps;Supine Short Arc Quad: AROM;Right;Supine;10 reps Heel Slides: AAROM;Right;10 reps;Supine;AROM Hip ABduction/ADduction: AROM;Right;Supine;15 reps(x10 reps in supine and x5 in standing.  ) Long Arc Quad: AROM;Right;10 reps;Supine Marching in Standing: AAROM;Right;5 reps;Standing Standing Hip Extension: AROM;Right;5 reps;Standing Bridges: AROM;Right;5 reps;Standing    General Comments        Pertinent Vitals/Pain Pain Assessment: 0-10 Pain Score: 8  Pain Location: R hip Pain Descriptors / Indicators: Discomfort;Grimacing;Operative site guarding;Sore Pain Intervention(s): Monitored during session;Repositioned    Home Living  Prior Function            PT Goals (current goals can now be found in the care plan section) Acute Rehab PT Goals Patient Stated Goal: less pain Potential to Achieve Goals: Good Progress towards PT goals: Progressing toward goals    Frequency    7X/week      PT Plan Current plan remains appropriate    Co-evaluation               AM-PAC PT "6 Clicks" Daily Activity  Outcome Measure  Difficulty turning over in bed (including adjusting bedclothes, sheets and blankets)?: A Lot Difficulty moving from lying on back to sitting on the side of the bed? : A Lot Difficulty sitting down on and standing up from a chair with arms (e.g., wheelchair, bedside commode, etc,.)?: Unable Help needed moving to and from a bed to chair (including a wheelchair)?: A Little Help needed walking in hospital room?: A Little Help needed climbing 3-5 steps with a railing? : A Little 6 Click Score: 14    End of Session Equipment Utilized During Treatment: Gait belt Activity Tolerance: Patient limited by pain Patient left: in bed;with call bell/phone within reach Nurse Communication: Mobility status PT Visit Diagnosis: Unsteadiness on feet (R26.81);Pain;Difficulty in walking, not elsewhere classified (R26.2) Pain - Right/Left: Right Pain - part of body: Hip     Time: 1610-9604 PT Time Calculation (min) (ACUTE ONLY): 28 min  Charges:  $Gait Training: 8-22 mins $Therapeutic Exercise: 8-22 mins                    G Codes:       Joycelyn Rua, PTA pager [phone]     Florestine Avers 07/12/2017, 11:20 AM

## 2017-07-13 LAB — GLUCOSE, CAPILLARY
Glucose-Capillary: 122 mg/dL — ABNORMAL HIGH (ref 65–99)
Glucose-Capillary: 121 mg/dL — ABNORMAL HIGH (ref 65–99)

## 2017-07-13 NOTE — Discharge Summary (Signed)
Patient ID: STONEY KARCZEWSKI MRN: [id number] DOB/AGE: [birth year] 57 y.o.  Admit date: 07/10/2017 Discharge date: 07/13/2017  Admission Diagnoses:  Principal Problem:   Unilateral primary osteoarthritis, right hip Active Problems:   Status post total replacement of right hip   Discharge Diagnoses:  Same  Past Medical History:  Diagnosis Date  . Arthritis   . Diabetes mellitus without complication (HCC)     Surgeries: Procedure(s): RIGHT TOTAL HIP ARTHROPLASTY ANTERIOR APPROACH on 07/10/2017   Consultants:   Discharged Condition: Improved  Hospital Course: JURON VORHEES is an 57 y.o. male who was admitted 07/10/2017 for operative treatment ofUnilateral primary osteoarthritis, right hip. Patient has severe unremitting pain that affects sleep, daily activities, and work/hobbies. After pre-op clearance the patient was taken to the operating room on 07/10/2017 and underwent  Procedure(s): RIGHT TOTAL HIP ARTHROPLASTY ANTERIOR APPROACH.    Patient was given perioperative antibiotics:  Anti-infectives (From admission, onward)   Start     Dose/Rate Route Frequency Ordered Stop   07/10/17 2015  ceFAZolin (ANCEF) IVPB 1 g/50 mL premix     1 g 100 mL/hr over 30 Minutes Intravenous Every 6 hours 07/10/17 1546 07/11/17 0443   07/10/17 1300  ceFAZolin (ANCEF) IVPB 2g/100 mL premix     2 g 200 mL/hr over 30 Minutes Intravenous To [facility] 07/09/17 1210 07/10/17 1400       Patient was given sequential compression devices, early ambulation, and chemoprophylaxis to prevent DVT.  Patient benefited maximally from hospital stay and there were no complications.    Recent vital signs:  Patient Vitals for the past 24 hrs:  BP Temp Temp src Pulse Resp SpO2  07/13/17 0454 116/68 98.4 F (36.9 C) Oral 69 16 99 %  07/12/17 2025 133/67 99.3 F (37.4 C) Oral 73 16 97 %  07/12/17 1420 129/68 98.3 F (36.8 C) Oral 74 17 100 %     Recent laboratory studies:  Recent Labs    07/11/17 0308   WBC 9.0  HGB 12.7*  HCT 37.9*  PLT 210  NA 140  K 4.5  CL 105  CO2 29  BUN 13  CREATININE 1.26*  GLUCOSE 125*  CALCIUM 8.8*     Discharge Medications:   Allergies as of 07/13/2017   No Known Allergies     Medication List    STOP taking these medications   aspirin 325 MG EC tablet Replaced by:  aspirin 81 MG chewable tablet     TAKE these medications   aspirin 81 MG chewable tablet Chew 1 tablet (81 mg total) by mouth 2 (two) times daily. Replaces:  aspirin 325 MG EC tablet   metFORMIN 500 MG tablet Commonly known as:  GLUCOPHAGE Take 500 mg by mouth 2 (two) times daily.   methocarbamol 500 MG tablet Commonly known as:  ROBAXIN Take 1 tablet (500 mg total) by mouth every 6 (six) hours as needed for muscle spasms.   oxyCODONE 5 MG immediate release tablet Commonly known as:  Oxy IR/ROXICODONE Take 1-2 tablets (5-10 mg total) by mouth every 4 (four) hours as needed for moderate pain (pain score 4-6).            Durable Medical Equipment  (From admission, onward)        Start     Ordered   07/10/17 2020  DME 3 n 1  Once     07/10/17 2019   07/10/17 2020  DME Walker rolling  Once    Question:  Patient  needs a walker to treat with the following condition  Answer:  Status post total replacement of right hip   07/10/17 2019      Diagnostic Studies: Dg Pelvis Portable  Result Date: 07/10/2017 CLINICAL DATA:  Postop EXAM: PORTABLE PELVIS 1-2 VIEWS COMPARISON:  07/10/2017 1450 hours FINDINGS: New right total hip arthroplasty is anatomically aligned. There is no breakage or loosening of the hardware. Left total hip arthroplasty is anatomically aligned and stable. IMPRESSION: New right total hip arthroplasty anatomically aligned. Electronically Signed   By: Jolaine Click M.D.   On: 07/10/2017 16:53   Dg C-arm 1-60 Min  Result Date: 07/10/2017 CLINICAL DATA:  Right hip replacement EXAM: DG C-ARM 61-120 MIN; OPERATIVE RIGHT HIP WITH PELVIS COMPARISON:  None.  FLUOROSCOPY TIME:  Fluoroscopy Time:  34 seconds Radiation Exposure Index (if provided by the fluoroscopic device): Not available Number of Acquired Spot Images: 3 FINDINGS: Right hip replacement is noted in satisfactory position. No acute bony or soft tissue abnormality is noted. IMPRESSION: Right hip replacement Electronically Signed   By: Alcide Clever M.D.   On: 07/10/2017 16:38   Dg Hip Operative Unilat With Pelvis Right  Result Date: 07/10/2017 CLINICAL DATA:  Right hip replacement EXAM: DG C-ARM 61-120 MIN; OPERATIVE RIGHT HIP WITH PELVIS COMPARISON:  None. FLUOROSCOPY TIME:  Fluoroscopy Time:  34 seconds Radiation Exposure Index (if provided by the fluoroscopic device): Not available Number of Acquired Spot Images: 3 FINDINGS: Right hip replacement is noted in satisfactory position. No acute bony or soft tissue abnormality is noted. IMPRESSION: Right hip replacement Electronically Signed   By: Alcide Clever M.D.   On: 07/10/2017 16:38   Xr Hip Unilat W Or W/o Pelvis 1v Right  Result Date: 06/20/2017 AP pelvis lateral view of the right hip: Bilateral hips well located.  No acute fractures.  Status post left total hip arthroplasty with well-seated components.  Right hip compared to films in 2017 showed progression of the joint space narrowing is near bone-on-bone at this point.  No other bony abnormalities.   Disposition: Discharge disposition: 01-Home or Self Care         Follow-up Information    Kathryne Hitch, MD. Schedule an appointment as soon as possible for a visit in 2 week(s).   Specialty:  Orthopedic Surgery Contact information: [address] [place] [address] [phone]        [facility] Follow up.   Specialty:  Home Health Services Why:  A representative from [facility] will contact you to arrange start date and time for your therapy. Contact information: [address] [place] [address] [place] [address] [phone]             Signed: Kathryne Hitch 07/13/2017, 6:45 AM

## 2017-07-13 NOTE — Progress Notes (Signed)
Patient ID: Keith Vaughn, male   DOB: 1960-09-19, 57 y.o.   MRN: [id number] No acute chagnes.  Doing well overall.  Vitals stable and right hip stable.  Can be discharged to home today.

## 2017-07-13 NOTE — Progress Notes (Signed)
Physical Therapy Treatment Patient Details Name: Keith Vaughn MRN: [id number] DOB: 1960/09/15 Today's Date: 07/13/2017    History of Present Illness Pt is a 57 y.o. male s/p R total hip arthroplasty through direct anterior approach. PMH significant for arthritis, diabetes mullitus without complication, R hand surgery, and L anterior total hip arthroplasty.     PT Comments    Pt performed gait and reviewed supine and seated exercises.  Pt with improved ability to actively perform movements with decreased pain.  Pt tolerated increased gait but remains to present with unsafe tendencies.  Pt educated on safety but remains non- compliant.  Informed nursing that patient is now ready for d/c and will not require a second session this pm.    Follow Up Recommendations  Follow surgeon's recommendation for DC plan and follow-up therapies     Equipment Recommendations  3in1 (PT)    Recommendations for Other Services       Precautions / Restrictions Precautions Precautions: None Restrictions Weight Bearing Restrictions: Yes RLE Weight Bearing: Weight bearing as tolerated    Mobility  Bed Mobility Overal bed mobility: Needs Assistance Bed Mobility: Supine to Sit     Supine to sit: Modified independent (Device/Increase time)     General bed mobility comments: NO assistance needed, remains to use hook technique to advance RLE to edge of bed.     Transfers Overall transfer level: Needs assistance Equipment used: Rolling walker (2 wheeled) Transfers: Sit to/from Stand Sit to Stand: Supervision Stand pivot transfers: Supervision       General transfer comment: Cues for hand placement to and from seated surface.  Pt with no LOB but remains to require cues for safety.    Ambulation/Gait Ambulation/Gait assistance: Supervision Ambulation Distance (Feet): 350 Feet Assistive device: Rolling walker (2 wheeled) Gait Pattern/deviations: Step-to pattern;Decreased weight shift to  right;Narrow base of support;Antalgic;Decreased stride length;Step-through pattern     General Gait Details: Pt able to progress to step through pattern with continuous movement of RW.  Pt likes to show off and show he can walk keeping one hand off the walker.  Pt educated this was not safe but he remains to report.  "I am fine, I am not gonna fall."   Stairs             Wheelchair Mobility    Modified Rankin (Stroke Patients Only)       Balance Overall balance assessment: Needs assistance   Sitting balance-Leahy Scale: Good       Standing balance-Leahy Scale: Poor                              Cognition Arousal/Alertness: Awake/alert Behavior During Therapy: Impulsive Overall Cognitive Status: Within Functional Limits for tasks assessed                                 General Comments: Remains impulsive.       Exercises Total Joint Exercises Ankle Circles/Pumps: AROM;Both;10 reps Quad Sets: AROM;Right;10 reps;Supine Short Arc Quad: AROM;Right;Supine;10 reps Heel Slides: AAROM;Right;10 reps;Supine;AROM Hip ABduction/ADduction: AROM;Right;Supine;10 reps Long Arc Quad: AROM;Right;10 reps;Seated    General Comments        Pertinent Vitals/Pain Pain Assessment: Faces Faces Pain Scale: Hurts little more Pain Location: R hip Pain Descriptors / Indicators: Discomfort;Grimacing;Operative site guarding;Sore Pain Intervention(s): Monitored during session;Repositioned    Home Living  Prior Function            PT Goals (current goals can now be found in the care plan section) Acute Rehab PT Goals Patient Stated Goal: less pain Potential to Achieve Goals: Good Progress towards PT goals: Progressing toward goals    Frequency    7X/week      PT Plan Current plan remains appropriate    Co-evaluation              AM-PAC PT "6 Clicks" Daily Activity  Outcome Measure  Difficulty turning  over in bed (including adjusting bedclothes, sheets and blankets)?: A Little Difficulty moving from lying on back to sitting on the side of the bed? : A Lot Difficulty sitting down on and standing up from a chair with arms (e.g., wheelchair, bedside commode, etc,.)?: A Lot Help needed moving to and from a bed to chair (including a wheelchair)?: A Little Help needed walking in hospital room?: A Little Help needed climbing 3-5 steps with a railing? : A Little 6 Click Score: 16    End of Session Equipment Utilized During Treatment: Gait belt Activity Tolerance: Patient limited by pain Patient left: in bed;with call bell/phone within reach Nurse Communication: Mobility status PT Visit Diagnosis: Unsteadiness on feet (R26.81);Pain;Difficulty in walking, not elsewhere classified (R26.2) Pain - Right/Left: Right Pain - part of body: Hip     Time: 4742-5956 PT Time Calculation (min) (ACUTE ONLY): 14 min  Charges:  $Gait Training: 8-22 mins                    G Codes:       Joycelyn Rua, PTA pager [phone]    Florestine Avers 07/13/2017, 9:57 AM

## 2017-07-13 NOTE — Progress Notes (Signed)
Discharge teaching complete. Meds, diet, activity, follow up appointments and incision care reviewed and all questions answered. Copy of instructions given to patient and prescriptions handed to patient.

## 2017-07-16 ENCOUNTER — Telehealth (INDEPENDENT_AMBULATORY_CARE_PROVIDER_SITE_OTHER): Payer: Self-pay | Admitting: Orthopaedic Surgery

## 2017-07-16 NOTE — Telephone Encounter (Signed)
Keith Vaughn -(PT) with [facility] called needing verbal orders for HHPT 1 wk 1 and 3 wk 1. The number to contact Keith Vaughn is [phone]

## 2017-07-16 NOTE — Telephone Encounter (Signed)
Verbal order left on VM  

## 2017-07-17 ENCOUNTER — Telehealth (INDEPENDENT_AMBULATORY_CARE_PROVIDER_SITE_OTHER): Payer: Self-pay | Admitting: Orthopaedic Surgery

## 2017-07-17 MED ORDER — OXYCODONE HCL 5 MG PO TABS: 5.0000 mg | ORAL_TABLET | ORAL | 0 refills | Status: DC | PRN

## 2017-07-17 NOTE — Telephone Encounter (Signed)
Please advise 

## 2017-07-17 NOTE — Telephone Encounter (Signed)
I escribed in some.

## 2017-07-17 NOTE — Telephone Encounter (Signed)
Patient called needing Rx refilled (Oxycodone)  Patient advised he has 3 tabs left. The  Number to contact patient is [phone]

## 2017-07-24 ENCOUNTER — Telehealth (INDEPENDENT_AMBULATORY_CARE_PROVIDER_SITE_OTHER): Payer: Self-pay | Admitting: Orthopaedic Surgery

## 2017-07-24 MED ORDER — METHOCARBAMOL 500 MG PO TABS: 500.0000 mg | ORAL_TABLET | Freq: Four times a day (QID) | ORAL | 0 refills | Status: DC | PRN

## 2017-07-24 MED ORDER — OXYCODONE HCL 5 MG PO TABS: 5.0000 mg | ORAL_TABLET | ORAL | 0 refills | Status: DC | PRN

## 2017-07-24 NOTE — Telephone Encounter (Signed)
Please advise 

## 2017-07-24 NOTE — Telephone Encounter (Signed)
I sent it in 

## 2017-07-24 NOTE — Telephone Encounter (Signed)
Patient called asking for a refill on his muscle relaxer's and oxycodone. CB # [phone]

## 2017-07-25 ENCOUNTER — Encounter (INDEPENDENT_AMBULATORY_CARE_PROVIDER_SITE_OTHER): Payer: Self-pay | Admitting: Orthopaedic Surgery

## 2017-07-25 ENCOUNTER — Ambulatory Visit (INDEPENDENT_AMBULATORY_CARE_PROVIDER_SITE_OTHER): Payer: Medicaid Other | Admitting: Orthopaedic Surgery

## 2017-07-25 DIAGNOSIS — Z96641 Presence of right artificial hip joint: Secondary | ICD-10-CM

## 2017-07-25 NOTE — Progress Notes (Signed)
The patient is following up in 2 weeks status post a right total hip arthroplasty.  He says he is doing well overall.  He is walking with a slight limp.  Is not using assistive device.  His right hip incision looks great.  We placed new Steri-Strips today.  His leg lengths are equal.  At this point since he is so active we will have him stop his twice a day aspirin.  Already sent in some more oxycodone for him yesterday.  We will continue to slowly increase his activities as comfort allows.  We will see him back in a month see how is doing overall but no x-rays are needed.

## 2017-07-31 ENCOUNTER — Telehealth (INDEPENDENT_AMBULATORY_CARE_PROVIDER_SITE_OTHER): Payer: Self-pay | Admitting: Orthopaedic Surgery

## 2017-07-31 MED ORDER — OXYCODONE HCL 5 MG PO TABS: 5.0000 mg | ORAL_TABLET | Freq: Four times a day (QID) | ORAL | 0 refills | Status: DC | PRN

## 2017-07-31 NOTE — Telephone Encounter (Signed)
Patient requests rx refill on oxycodone, please advise patient on either # [phone] or [phone]

## 2017-07-31 NOTE — Telephone Encounter (Signed)
Please advise 

## 2017-07-31 NOTE — Telephone Encounter (Signed)
Patient aware called into pharmacy

## 2017-07-31 NOTE — Telephone Encounter (Signed)
I escribed some in 

## 2017-08-08 ENCOUNTER — Telehealth (INDEPENDENT_AMBULATORY_CARE_PROVIDER_SITE_OTHER): Payer: Self-pay | Admitting: Orthopaedic Surgery

## 2017-08-08 MED ORDER — METHOCARBAMOL 500 MG PO TABS: 500.0000 mg | ORAL_TABLET | Freq: Four times a day (QID) | ORAL | 0 refills | Status: DC | PRN

## 2017-08-08 MED ORDER — OXYCODONE HCL 5 MG PO TABS: 5.0000 mg | ORAL_TABLET | Freq: Four times a day (QID) | ORAL | 0 refills | Status: DC | PRN

## 2017-08-08 NOTE — Telephone Encounter (Signed)
Patient called needing Rx's refilled (Methocarbamol 500mg  and Oxycodone).  The number to contact patient is [phone]

## 2017-08-08 NOTE — Telephone Encounter (Signed)
Please advise 

## 2017-08-16 ENCOUNTER — Telehealth (INDEPENDENT_AMBULATORY_CARE_PROVIDER_SITE_OTHER): Payer: Self-pay | Admitting: Orthopaedic Surgery

## 2017-08-16 ENCOUNTER — Other Ambulatory Visit (INDEPENDENT_AMBULATORY_CARE_PROVIDER_SITE_OTHER): Payer: Self-pay | Admitting: Physician Assistant

## 2017-08-16 MED ORDER — OXYCODONE HCL 5 MG PO TABS: 5.0000 mg | ORAL_TABLET | Freq: Four times a day (QID) | ORAL | 0 refills | Status: DC | PRN

## 2017-08-16 NOTE — Telephone Encounter (Signed)
Please advise 

## 2017-08-16 NOTE — Telephone Encounter (Signed)
Patient requesting rx refill on oxycodone. # [phone]

## 2017-08-16 NOTE — Telephone Encounter (Signed)
Placed rx on your desk

## 2017-08-16 NOTE — Telephone Encounter (Signed)
Patient aware at front desk  

## 2017-08-22 ENCOUNTER — Ambulatory Visit (INDEPENDENT_AMBULATORY_CARE_PROVIDER_SITE_OTHER): Payer: Medicaid Other | Admitting: Orthopaedic Surgery

## 2017-08-22 ENCOUNTER — Encounter (INDEPENDENT_AMBULATORY_CARE_PROVIDER_SITE_OTHER): Payer: Self-pay | Admitting: Orthopaedic Surgery

## 2017-08-22 DIAGNOSIS — Z96641 Presence of right artificial hip joint: Secondary | ICD-10-CM

## 2017-08-22 MED ORDER — HYDROCODONE-ACETAMINOPHEN 5-325 MG PO TABS: 1.0000 | ORAL_TABLET | Freq: Four times a day (QID) | ORAL | 0 refills | Status: DC | PRN

## 2017-08-22 MED ORDER — METHOCARBAMOL 500 MG PO TABS: 500.0000 mg | ORAL_TABLET | Freq: Four times a day (QID) | ORAL | 0 refills | Status: AC | PRN

## 2017-08-22 NOTE — Progress Notes (Signed)
The patient is now 6 weeks status post right total hip arthroplasty and 1664-month status post a left total hip arthroplasty.  He is back in the gym doing exercising.  He is doing well overall.  He still has some start up pain when he first gets up to a standing position from sitting down.  He still struggles he states getting in and out of a car.  Overall he is walking without assistive device and a slight limp.  His ligaments are equal.  He can easily rotate his hips around without any difficulty at all.  At this point to continue increase his activities as comfort allows.  I want him to avoid any squats or lunges until late September of this year.  I do not need to see him back for 6 months.  At that visit I would like just a low AP pelvis.  I did send in one more prescription for hydrocodone and Robaxin.

## 2017-08-27 ENCOUNTER — Encounter (HOSPITAL_COMMUNITY): Payer: Self-pay | Admitting: Emergency Medicine

## 2017-08-27 ENCOUNTER — Inpatient Hospital Stay (HOSPITAL_COMMUNITY)
Admission: EM | Admit: 2017-08-27 | Discharge: 2017-08-30 | DRG: 373 | Disposition: A | Discharge status: Home / Self Care | Payer: Medicaid Other | Attending: General Surgery | Admitting: General Surgery

## 2017-08-27 ENCOUNTER — Emergency Department (HOSPITAL_COMMUNITY): Payer: Medicaid Other

## 2017-08-27 DIAGNOSIS — E119 Type 2 diabetes mellitus without complications: Secondary | ICD-10-CM | POA: Diagnosis present

## 2017-08-27 DIAGNOSIS — R109 Unspecified abdominal pain: Secondary | ICD-10-CM | POA: Diagnosis present

## 2017-08-27 DIAGNOSIS — Z96643 Presence of artificial hip joint, bilateral: Secondary | ICD-10-CM | POA: Diagnosis present

## 2017-08-27 DIAGNOSIS — M1990 Unspecified osteoarthritis, unspecified site: Secondary | ICD-10-CM | POA: Diagnosis present

## 2017-08-27 DIAGNOSIS — F17210 Nicotine dependence, cigarettes, uncomplicated: Secondary | ICD-10-CM | POA: Diagnosis present

## 2017-08-27 DIAGNOSIS — K3532 Acute appendicitis with perforation and localized peritonitis, without abscess: Secondary | ICD-10-CM | POA: Diagnosis not present

## 2017-08-27 HISTORY — DX: Type 2 diabetes mellitus without complications: E11.9

## 2017-08-27 LAB — URINALYSIS, ROUTINE W REFLEX MICROSCOPIC
BILIRUBIN URINE: NEGATIVE
Bacteria, UA: NONE SEEN
GLUCOSE, UA: NEGATIVE mg/dL
Ketones, ur: NEGATIVE mg/dL
Leukocytes, UA: NEGATIVE
Nitrite: NEGATIVE
Protein, ur: NEGATIVE mg/dL
pH: 7 (ref 5.0–8.0)

## 2017-08-27 LAB — COMPREHENSIVE METABOLIC PANEL
ALK PHOS: 61 U/L (ref 38–126)
ALT: 16 U/L — ABNORMAL LOW (ref 17–63)
AST: 21 U/L (ref 15–41)
Albumin: 3.7 g/dL (ref 3.5–5.0)
Anion gap: 8 (ref 5–15)
BILIRUBIN TOTAL: 0.6 mg/dL (ref 0.3–1.2)
BUN: 14 mg/dL (ref 6–20)
CALCIUM: 9.4 mg/dL (ref 8.9–10.3)
CO2: 28 mmol/L (ref 22–32)
CREATININE: 1.24 mg/dL (ref 0.61–1.24)
Chloride: 104 mmol/L (ref 101–111)
GFR calc Af Amer: >60 mL/min (ref >60)
GFR calc non Af Amer: >60 mL/min (ref >60)
GLUCOSE: 93 mg/dL (ref 65–99)
Potassium: 4.8 mmol/L (ref 3.5–5.1)
Sodium: 140 mmol/L (ref 135–145)
Total Protein: 7 g/dL (ref 6.5–8.1)

## 2017-08-27 LAB — CBC
HCT: 37.4 % — ABNORMAL LOW (ref 39.0–52.0)
Hemoglobin: 12.1 g/dL — ABNORMAL LOW (ref 13.0–17.0)
MCH: 27.7 pg (ref 26.0–34.0)
MCHC: 32.4 g/dL (ref 30.0–36.0)
MCV: 85.6 fL (ref 78.0–100.0)
PLATELETS: 264 10*3/uL (ref 150–400)
RBC: 4.37 MIL/uL (ref 4.22–5.81)
RDW: 13.6 % (ref 11.5–15.5)
WBC: 10.7 10*3/uL — ABNORMAL HIGH (ref 4.0–10.5)

## 2017-08-27 LAB — LIPASE, BLOOD: Lipase: 79 U/L — ABNORMAL HIGH (ref 11–51)

## 2017-08-27 LAB — GLUCOSE, CAPILLARY: Glucose-Capillary: 136 mg/dL — ABNORMAL HIGH (ref 65–99)

## 2017-08-27 MED ORDER — ENOXAPARIN SODIUM 40 MG/0.4ML ~~LOC~~ SOLN
40.0000 mg | SUBCUTANEOUS | Status: DC
  Administered 2017-08-27 – 2017-08-28 (×2): 40 mg via SUBCUTANEOUS
  Filled 2017-08-27 (×3): qty 0.4

## 2017-08-27 MED ORDER — ACETAMINOPHEN 650 MG RE SUPP: 650.0000 mg | Freq: Four times a day (QID) | RECTAL | Status: DC | PRN

## 2017-08-27 MED ORDER — HYDROMORPHONE HCL 2 MG/ML IJ SOLN
0.5000 mg | INTRAMUSCULAR | Status: DC | PRN
  Administered 2017-08-27 – 2017-08-28 (×2): 0.5 mg via INTRAVENOUS
  Filled 2017-08-27 (×2): qty 1

## 2017-08-27 MED ORDER — METHOCARBAMOL 500 MG PO TABS
500.0000 mg | ORAL_TABLET | Freq: Four times a day (QID) | ORAL | Status: DC | PRN
  Administered 2017-08-28 – 2017-08-30 (×4): 500 mg via ORAL
  Filled 2017-08-27 (×4): qty 1

## 2017-08-27 MED ORDER — PIPERACILLIN-TAZOBACTAM 3.375 G IVPB
3.3750 g | Freq: Three times a day (TID) | INTRAVENOUS | Status: DC
  Administered 2017-08-28 – 2017-08-29 (×5): 3.375 g via INTRAVENOUS
  Filled 2017-08-27 (×6): qty 50

## 2017-08-27 MED ORDER — ONDANSETRON 4 MG PO TBDP: 4.0000 mg | ORAL_TABLET | Freq: Four times a day (QID) | ORAL | Status: DC | PRN

## 2017-08-27 MED ORDER — HYDROMORPHONE HCL 1 MG/ML IJ SOLN
0.5000 mg | INTRAMUSCULAR | Status: DC | PRN
  Administered 2017-08-27: 0.5 mg via INTRAVENOUS
  Filled 2017-08-27: qty 1

## 2017-08-27 MED ORDER — ACETAMINOPHEN 325 MG PO TABS: 650.0000 mg | ORAL_TABLET | Freq: Four times a day (QID) | ORAL | Status: DC | PRN

## 2017-08-27 MED ORDER — PIPERACILLIN-TAZOBACTAM 3.375 G IVPB 30 MIN
3.3750 g | Freq: Once | INTRAVENOUS | Status: AC
  Administered 2017-08-27: 3.375 g via INTRAVENOUS
  Filled 2017-08-27: qty 50

## 2017-08-27 MED ORDER — SIMETHICONE 80 MG PO CHEW: 40.0000 mg | CHEWABLE_TABLET | Freq: Four times a day (QID) | ORAL | Status: DC | PRN

## 2017-08-27 MED ORDER — ONDANSETRON HCL 4 MG/2ML IJ SOLN
4.0000 mg | Freq: Four times a day (QID) | INTRAMUSCULAR | Status: DC | PRN
  Administered 2017-08-27: 4 mg via INTRAVENOUS
  Filled 2017-08-27: qty 2

## 2017-08-27 MED ORDER — HYDROMORPHONE HCL 1 MG/ML IJ SOLN
0.5000 mg | Freq: Once | INTRAMUSCULAR | Status: AC
  Administered 2017-08-27: 0.5 mg via INTRAVENOUS
  Filled 2017-08-27: qty 1

## 2017-08-27 MED ORDER — SODIUM CHLORIDE 0.9 % IV SOLN
INTRAVENOUS | Status: DC
  Administered 2017-08-27 – 2017-08-29 (×3): via INTRAVENOUS

## 2017-08-27 MED ORDER — INSULIN ASPART 100 UNIT/ML ~~LOC~~ SOLN: 0.0000 [IU] | Freq: Three times a day (TID) | SUBCUTANEOUS | Status: DC

## 2017-08-27 MED ORDER — IOHEXOL 300 MG/ML  SOLN
100.0000 mL | Freq: Once | INTRAMUSCULAR | Status: AC | PRN
  Administered 2017-08-27: 100 mL via INTRAVENOUS

## 2017-08-27 NOTE — H&P (Signed)
Keith Vaughn is an 57 y.o. male.   Chief Complaint: likely appendicitis HPI: 44 yom who presents with 5 days of abdominal symptoms. Starting last Thursday he noted bloating and mild pain. He had episode of emesis on Friday, nothing further. Has been having flatus and bms that are normal. Does state he has had csc in past, not sure when.  He states abd pain has been waxing and waning over this time and just got to where he couldn't take it today. No fevers. Has been eating.  He was seen in urgent care and referred to er. He underwent ct scan that shows fluid collection likely rlq appendix not really seen.   Had right hip replacement 5/7  Past Medical History:  Diagnosis Date  . Arthritis   . Diabetes mellitus without complication [facility])     Past Surgical History:  Procedure Laterality Date  . HAND SURGERY Right    bolts and screws  . TOTAL HIP ARTHROPLASTY Left 11/16/2015   Procedure: LEFT TOTAL HIP ARTHROPLASTY ANTERIOR APPROACH;  Surgeon: Mcarthur Rossetti, MD;  Location: [facility];  Service: Orthopedics;  Laterality: Left;  . TOTAL HIP ARTHROPLASTY Right 07/10/2017  . TOTAL HIP ARTHROPLASTY Right 07/10/2017   Procedure: RIGHT TOTAL HIP ARTHROPLASTY ANTERIOR APPROACH;  Surgeon: Mcarthur Rossetti, MD;  Location: [facility];  Service: Orthopedics;  Laterality: Right;    No family history on file. Social History:  reports that he has been smoking cigarettes.  He has been smoking about 1.00 pack per day. He has never used smokeless tobacco. He reports that he has current or past drug history. Drugs: "Crack" cocaine, Cocaine, and Marijuana. He reports that he does not drink alcohol.  Allergies: No Known Allergies  meds metformin, pain meds after hip  Results for orders placed or performed during the hospital encounter of 08/27/17 (from the past 48 hour(s))  Lipase, blood     Status: Abnormal   Collection Time: 08/27/17 12:55 PM  Result Value Ref Range   Lipase 79 (H) 11 - 51 U/L   Comment: Performed at [facility], [name], [place], [place] [address]  Comprehensive metabolic panel     Status: Abnormal   Collection Time: 08/27/17 12:55 PM  Result Value Ref Range   Sodium 140 135 - 145 mmol/L   Potassium 4.8 3.5 - 5.1 mmol/L   Chloride 104 101 - 111 mmol/L   CO2 28 22 - 32 mmol/L   Glucose, Bld 93 65 - 99 mg/dL   BUN 14 6 - 20 mg/dL   Creatinine, Ser 1.24 0.61 - 1.24 mg/dL   Calcium 9.4 8.9 - 10.3 mg/dL   Total Protein 7.0 6.5 - 8.1 g/dL   Albumin 3.7 3.5 - 5.0 g/dL   AST 21 15 - 41 U/L   ALT 16 (L) 17 - 63 U/L   Alkaline Phosphatase 61 38 - 126 U/L   Total Bilirubin 0.6 0.3 - 1.2 mg/dL   GFR calc non Af Amer >60 >60 mL/min   GFR calc Af Amer >60 >60 mL/min    Comment: (NOTE) The eGFR has been calculated using the CKD EPI equation. This calculation has not been validated in all clinical situations. eGFR's persistently <60 mL/min signify possible Chronic Kidney Disease.    Anion gap 8 5 - 15    Comment: Performed at [facility] [address] [place], [place] [address]  CBC     Status: Abnormal   Collection Time: 08/27/17  12:55 PM  Result Value Ref Range   WBC 10.7 (H) 4.0 - 10.5 K/uL   RBC 4.37 4.22 - 5.81 MIL/uL   Hemoglobin 12.1 (L) 13.0 - 17.0 g/dL   HCT 37.4 (L) 39.0 - 52.0 %   MCV 85.6 78.0 - 100.0 fL   MCH 27.7 26.0 - 34.0 pg   MCHC 32.4 30.0 - 36.0 g/dL   RDW 13.6 11.5 - 15.5 %   Platelets 264 150 - 400 K/uL    Comment: Performed at [facility] [address] [place], [place] [address]  Urinalysis, Routine w reflex microscopic     Status: Abnormal   Collection Time: 08/27/17  3:55 PM  Result Value Ref Range   Color, Urine STRAW (A) YELLOW   APPearance CLEAR CLEAR   Specific Gravity, Urine >1.046 (H) 1.005 - 1.030   pH 7.0 5.0 - 8.0   Glucose, UA NEGATIVE NEGATIVE mg/dL   Hgb urine dipstick SMALL (A) NEGATIVE   Bilirubin Urine NEGATIVE NEGATIVE   Ketones, ur NEGATIVE NEGATIVE mg/dL   Protein, ur NEGATIVE  NEGATIVE mg/dL   Nitrite NEGATIVE NEGATIVE   Leukocytes, UA NEGATIVE NEGATIVE   RBC / HPF 0-5 0 - 5 RBC/hpf   WBC, UA 0-5 0 - 5 WBC/hpf   Bacteria, UA NONE SEEN NONE SEEN    Comment: Performed at [facility] [address], [place], [place] [address]   Ct Abdomen Pelvis W Contrast  Result Date: 08/27/2017 CLINICAL DATA:  Lower abdominal pain. EXAM: CT ABDOMEN AND PELVIS WITH CONTRAST TECHNIQUE: Multidetector CT imaging of the abdomen and pelvis was performed using the standard protocol following bolus administration of intravenous contrast. CONTRAST:  147m OMNIPAQUE IOHEXOL 300 MG/ML  SOLN COMPARISON:  None. FINDINGS: Lower chest: No acute abnormality. Hepatobiliary: No focal liver abnormality is seen. No gallstones, gallbladder wall thickening, or biliary dilatation. Pancreas: Unremarkable. No pancreatic ductal dilatation or surrounding inflammatory changes. Spleen: Normal in size without focal abnormality. Adrenals/Urinary Tract: Normal adrenal glands. No urolithiasis or obstructive uropathy. 2.7 x 2.5 cm hypodense, fluid attenuating left renal mass most consistent with a cyst. No solid renal mass. Normal bladder. Stomach/Bowel: Stomach is within normal limits. No evidence of bowel wall thickening, distention, or inflammatory changes. No normal nor abnormal appendix is identified. 2.5 x 3.3 cm complex fluid collection in the right lower quadrant which is difficult to distinguish from fluid-filled loops of adjacent small bowel. Vascular/Lymphatic: No significant vascular findings are present. No enlarged abdominal or pelvic lymph nodes. Reproductive: Prostate is unremarkable. Other: No abdominal wall hernia or abnormality. No abdominopelvic ascites. Musculoskeletal: No acute or significant osseous findings. 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 Severe bilateral facet arthropathy at L5-S1. Bilateral facet arthropathy throughout the lumbar spine. IMPRESSION: 1. No normal nor  abnormal appendix is identified. 2.5 x 3.3 cm complex fluid collection in the right lower quadrant which is difficult to distinguish from fluid-filled loops of adjacent small bowel. The appearance is concerning for an abscess which may be secondary to appendicitis. The lack of oral contrast material limits evaluation. If there is further clinical concern, recommend repeat CT abdomen and pelvis with oral contrast. Electronically Signed   By: HKathreen Devoid  On: 08/27/2017 16:32    Review of Systems  Gastrointestinal: Positive for abdominal pain and vomiting. Negative for blood in stool, constipation and nausea.  All other systems reviewed and are negative.   Blood pressure (!) 155/83, pulse 66, temperature 98.6 F (37  C), temperature source Oral, resp. rate 18, SpO2 100 %. Physical Exam  Vitals reviewed. Constitutional: He is oriented to person, place, and time. He appears well-developed and well-nourished.  HENT:  Head: Normocephalic and atraumatic.  Mouth/Throat: Oropharynx is clear and moist.  Eyes: Pupils are equal, round, and reactive to light. No scleral icterus.  Neck: Neck supple.  Cardiovascular: Normal rate, regular rhythm, normal heart sounds and intact distal pulses.  Respiratory: Effort normal and breath sounds normal. He has no wheezes.  GI: Soft. Bowel sounds are normal. There is tenderness in the right lower quadrant. There is tenderness at McBurney's point. No hernia.  Lymphadenopathy:    He has no cervical adenopathy.  Neurological: He is alert and oriented to person, place, and time.  Skin: Skin is warm and dry. He is not diaphoretic.  Psychiatric: He has a normal mood and affect. His behavior is normal.     Assessment/Plan Perforated appendicitis  I think historically and with ct scan he likely has appendicitis that is perforated with small abscess. I think reasonable given vitals, wbc and exam to attempt to treat conservatively.  Will admit him on abx, let him have  clear liquids.  We discussed that may resolve with abx alone. If gets worse or does not get better may need surgery or repeat ct scan to see if anything to drain. He is agreeable to this plan Rolm Bookbinder, MD 08/27/2017, 6:15 PM

## 2017-08-27 NOTE — ED Notes (Signed)
Pt in CT.

## 2017-08-27 NOTE — ED Notes (Signed)
Pt remains in CT

## 2017-08-27 NOTE — Progress Notes (Signed)
Pharmacy Antibiotic Note  Keith Vaughn is a 57 y.o. male admitted on 08/27/2017 with likley appendicitis.  Pharmacy has been consulted for zosyn dosing. First dose already given tonight.  Plan: Zosyn 3.375 gm IV q8h, infuse over 4 hours   Height: 5\' 9"  (175.3 cm) Weight: 153 lb 8 oz (69.6 kg) IBW/kg (Calculated) : 70.7  Temp (24hrs), Avg:98.6 F (37 C), Min:98.5 F (36.9 C), Max:98.6 F (37 C)  Recent Labs  Lab 08/27/17 1255  WBC 10.7*  CREATININE 1.24    Estimated Creatinine Clearance: 65.5 mL/min (by C-G formula based on SCr of 1.24 mg/dL).    No Known Allergies  Antimicrobials this admission: Zosyn 6/24>>  Dose adjustments this admission:   Microbiology results:none  Thank you for allowing pharmacy to be a part of this patient's care. Keith Vaughn, RPh Clinical Pharmacist [phone] main pharmacy.  08/27/2017 10:25 PM

## 2017-08-27 NOTE — ED Notes (Signed)
Consulting provider at bedside

## 2017-08-27 NOTE — ED Triage Notes (Signed)
Patient [facility] for r/o appendicitis. Patient reports RLQ pain x 4 days with N/V on Friday. Patient denies fevers/chills. Rebound tenderness noted.

## 2017-08-27 NOTE — ED Provider Notes (Signed)
[facility] EMERGENCY DEPARTMENT Provider Note   CSN: [id number] Arrival date & time: 08/27/17  1202   History   Chief Complaint Chief Complaint  Patient presents with  . Abdominal Pain    HPI Keith Vaughn is a 57 y.o. male.  HPI   57 year old male presents today with complaints of abdominal pain.  She notes a 4-day history of worsening abdominal pain.  He notes this hurts in his right lower abdomen.  He notes he is been tolerating p.o. but has had decreased appetite since last night.  Patient notes last oral intake was around 9 AM this morning.  Patient notes one episode of vomiting on Friday, none since.  He denies any fever.  He denies any urinary or bowel changes.  Patient is status post hip replacement approximately 1 month ago.  Past Medical History:  Diagnosis Date  . Arthritis   . Diabetes mellitus without complication [facility])     Patient Active Problem List   Diagnosis Date Noted  . Acute perforated appendicitis 08/27/2017  . Unilateral primary osteoarthritis, right hip 07/10/2017  . Status post total replacement of right hip 07/10/2017  . Osteoarthritis of left hip 11/16/2015  . Status post left hip replacement 11/16/2015    Past Surgical History:  Procedure Laterality Date  . HAND SURGERY Right    bolts and screws  . TOTAL HIP ARTHROPLASTY Left 11/16/2015   Procedure: LEFT TOTAL HIP ARTHROPLASTY ANTERIOR APPROACH;  Surgeon: Kathryne Hitch, MD;  Location: [facility] OR;  Service: Orthopedics;  Laterality: Left;  . TOTAL HIP ARTHROPLASTY Right 07/10/2017  . TOTAL HIP ARTHROPLASTY Right 07/10/2017   Procedure: RIGHT TOTAL HIP ARTHROPLASTY ANTERIOR APPROACH;  Surgeon: Kathryne Hitch, MD;  Location: [facility] OR;  Service: Orthopedics;  Laterality: Right;        Home Medications    Prior to Admission medications   Medication Sig Start Date End Date Taking? Authorizing Provider  HYDROcodone-acetaminophen (NORCO/VICODIN) 5-325 MG tablet Take  1-2 tablets by mouth every 6 (six) hours as needed for moderate pain. 08/22/17  Yes Kathryne Hitch, MD  metFORMIN (GLUCOPHAGE) 500 MG tablet Take 500 mg by mouth 2 (two) times daily.  12/10/15  Yes [provider]  methocarbamol (ROBAXIN) 500 MG tablet Take 1 tablet (500 mg total) by mouth every 6 (six) hours as needed for muscle spasms. 08/22/17  Yes Kathryne Hitch, MD  oxyCODONE (OXY IR/ROXICODONE) 5 MG immediate release tablet Take 1-2 tablets (5-10 mg total) by mouth every 6 (six) hours as needed for moderate pain (pain score 4-6). 08/16/17  Yes Kirtland Bouchard, PA-C  aspirin 81 MG chewable tablet Chew 1 tablet (81 mg total) by mouth 2 (two) times daily. Patient not taking: Reported on 08/27/2017 07/12/17   Kirtland Bouchard, PA-C    Family History No family history on file.  Social History Social History   Tobacco Use  . Smoking status: Current Every Day Smoker    Packs/day: 1.00    Types: Cigarettes  . Smokeless tobacco: Never Used  . Tobacco comment: onlt 3 cigarettes  a day  Substance Use Topics  . Alcohol use: No    Comment: quit 5 years ago  . Drug use: Yes    Types: "Crack" cocaine, Cocaine, Marijuana    Comment: none used in almost 5 years     Allergies   Patient has no known allergies.   Review of Systems Review of Systems  All other systems reviewed and are  negative.   Physical Exam Updated Vital Signs BP 119/70 (BP Location: Right Arm)   Pulse 60   Temp 98.5 F (36.9 C) (Oral)   Resp 18   SpO2 98%   Physical Exam  Constitutional: He is oriented to person, place, and time. He appears well-developed and well-nourished.  HENT:  Head: Normocephalic and atraumatic.  Eyes: Pupils are equal, round, and reactive to light. Conjunctivae are normal. Right eye exhibits no discharge. Left eye exhibits no discharge. No scleral icterus.  Neck: Normal range of motion. No JVD present. No tracheal deviation present.  Pulmonary/Chest: Effort  normal. No stridor.  Abdominal:  Tenderness palpation of right lower quadrant remainder of abdomen soft nontender, no masses  Neurological: He is alert and oriented to person, place, and time. Coordination normal.  Psychiatric: He has a normal mood and affect. His behavior is normal. Judgment and thought content normal.  Nursing note and vitals reviewed.    ED Treatments / Results  Labs (all labs ordered are listed, but only abnormal results are displayed) Labs Reviewed  LIPASE, BLOOD - Abnormal; Notable for the following components:      Result Value   Lipase 79 (*)    All other components within normal limits  COMPREHENSIVE METABOLIC PANEL - Abnormal; Notable for the following components:   ALT 16 (*)    All other components within normal limits  CBC - Abnormal; Notable for the following components:   WBC 10.7 (*)    Hemoglobin 12.1 (*)    HCT 37.4 (*)    All other components within normal limits  URINALYSIS, ROUTINE W REFLEX MICROSCOPIC - Abnormal; Notable for the following components:   Color, Urine STRAW (*)    Specific Gravity, Urine >1.046 (*)    Hgb urine dipstick SMALL (*)    All other components within normal limits  BASIC METABOLIC PANEL  CBC  I-STAT CHEM 8, ED    EKG None  Radiology Ct Abdomen Pelvis W Contrast  Result Date: 08/27/2017 CLINICAL DATA:  Lower abdominal pain. EXAM: CT ABDOMEN AND PELVIS WITH CONTRAST TECHNIQUE: Multidetector CT imaging of the abdomen and pelvis was performed using the standard protocol following bolus administration of intravenous contrast. CONTRAST:  OMNIPAQUE IOHEXOL 300 MG/ML  SOLN COMPARISON:  None. FINDINGS: Lower chest: No acute abnormality. Hepatobiliary: No focal liver abnormality is seen. No gallstones, gallbladder wall thickening, or biliary dilatation. Pancreas: Unremarkable. No pancreatic ductal dilatation or surrounding inflammatory changes. Spleen: Normal in size without focal abnormality. Adrenals/Urinary Tract:  Normal adrenal glands. No urolithiasis or obstructive uropathy. 2.7 x 2.5 cm hypodense, fluid attenuating left renal mass most consistent with a cyst. No solid renal mass. Normal bladder. Stomach/Bowel: Stomach is within normal limits. No evidence of bowel wall thickening, distention, or inflammatory changes. No normal nor abnormal appendix is identified. 2.5 x 3.3 cm complex fluid collection in the right lower quadrant which is difficult to distinguish from fluid-filled loops of adjacent small bowel. Vascular/Lymphatic: No significant vascular findings are present. No enlarged abdominal or pelvic lymph nodes. Reproductive: Prostate is unremarkable. Other: No abdominal wall hernia or abnormality. No abdominopelvic ascites. Musculoskeletal: No acute or significant osseous findings. 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 Severe bilateral facet arthropathy at L5-S1. Bilateral facet arthropathy throughout the lumbar spine. IMPRESSION: 1. No normal nor abnormal appendix is identified. 2.5 x 3.3 cm complex fluid collection in the right lower quadrant which is difficult to distinguish from fluid-filled loops of adjacent small bowel. The appearance  is concerning for an abscess which may be secondary to appendicitis. The lack of oral contrast material limits evaluation. If there is further clinical concern, recommend repeat CT abdomen and pelvis with oral contrast. Electronically Signed   By: Elige KoHetal  Patel   On: 08/27/2017 16:32    Procedures Procedures (including critical care time)  Medications Ordered in ED Medications  enoxaparin (LOVENOX) injection 40 mg (has no administration in time range)  0.9 %  sodium chloride infusion (has no administration in time range)  acetaminophen (TYLENOL) tablet 650 mg (has no administration in time range)    Or  acetaminophen (TYLENOL) suppository 650 mg (has no administration in time range)  methocarbamol (ROBAXIN) tablet 500 mg (has no administration  in time range)  ondansetron (ZOFRAN-ODT) disintegrating tablet 4 mg ( Oral See Alternative 08/27/17 1832)    Or  ondansetron (ZOFRAN) injection 4 mg (4 mg Intravenous Given 08/27/17 1832)  simethicone (MYLICON) chewable tablet 40 mg (has no administration in time range)  insulin aspart (novoLOG) injection 0-15 Units (has no administration in time range)  HYDROmorphone (DILAUDID) injection 0.5 mg (has no administration in time range)  HYDROmorphone (DILAUDID) injection 0.5 mg (0.5 mg Intravenous Given 08/27/17 1530)  iohexol (OMNIPAQUE) 300 MG/ML solution 100 mL (100 mLs Intravenous Contrast Given 08/27/17 1617)  piperacillin-tazobactam (ZOSYN) IVPB 3.375 g (0 g Intravenous Stopped 08/27/17 1904)     Initial Impression / Assessment and Plan / ED Course  I have reviewed the triage vital signs and the nursing notes.  Pertinent labs & imaging results that were available during my care of the patient were reviewed by me and considered in my medical decision making (see chart for details).     Labs: lipase, CMP, CBC  Imaging: CT abd pelvis with   Consults: General surgery  Therapeutics: Dilaudid  Discharge Meds:   Assessment/Plan: 57 year old male presents today with complaints of abdominal pain.  His presentation is most consistent with appendicitis, question perforation and abscess given CT findings and clinical history.  Patient is nontoxic at this time.  General surgery consulted who would evaluate patient at bedside.  Surgery to admit for further management.    Final Clinical Impressions(s) / ED Diagnoses   Final diagnoses:  Acute perforated appendicitis    ED Discharge Orders    None       Rosalio LoudHedges, Shanera Meske, PA-C 08/27/17 2111    Raeford RazorKohut, Stephen, MD 08/29/17 1241

## 2017-08-27 NOTE — ED Provider Notes (Signed)
Patient placed in Quick Look pathway, seen and evaluated   Chief Complaint: abdominal pain  HPI:   Onset of abdominal pain 4 days ago that has gotten progressively worse with n/v and decreased appetite.   ROS:  GI: n/v, abdominal pain  Physical Exam:  BP 124/80 (BP Location: Right Arm)   Pulse 67   Temp 98.6 F (37 C) (Oral)   Resp 18   SpO2 100%    Gen: No distress  Neuro: Awake and Alert  Skin: Warm and dry  Abdomen: soft, normal BS, tender RLQ with rebound.      Initiation of care has begun. The patient has been counseled on the process, plan, and necessity for staying for the completion/evaluation, and the remainder of the medical screening examination    Janne Napoleoneese, Hope M, NP 08/27/17 1300    Loren RacerYelverton, David, MD 08/28/17 [phone]

## 2017-08-28 ENCOUNTER — Other Ambulatory Visit: Payer: Self-pay

## 2017-08-28 ENCOUNTER — Encounter (HOSPITAL_COMMUNITY): Payer: Self-pay | Admitting: General Practice

## 2017-08-28 LAB — BASIC METABOLIC PANEL
ANION GAP: 7 (ref 5–15)
BUN: 11 mg/dL (ref 6–20)
CHLORIDE: 104 mmol/L (ref 98–111)
CO2: 27 mmol/L (ref 22–32)
CREATININE: 1.3 mg/dL — AB (ref 0.61–1.24)
Calcium: 9 mg/dL (ref 8.9–10.3)
GFR calc non Af Amer: 60 mL/min — ABNORMAL LOW (ref >60)
Glucose, Bld: 98 mg/dL (ref 70–99)
Potassium: 4.1 mmol/L (ref 3.5–5.1)
SODIUM: 138 mmol/L (ref 135–145)

## 2017-08-28 LAB — CBC
HCT: 36.1 % — ABNORMAL LOW (ref 39.0–52.0)
HEMOGLOBIN: 11.6 g/dL — AB (ref 13.0–17.0)
MCH: 27.6 pg (ref 26.0–34.0)
MCHC: 32.1 g/dL (ref 30.0–36.0)
MCV: 85.7 fL (ref 78.0–100.0)
PLATELETS: 256 10*3/uL (ref 150–400)
RBC: 4.21 MIL/uL — AB (ref 4.22–5.81)
RDW: 13.6 % (ref 11.5–15.5)
WBC: 10.2 10*3/uL (ref 4.0–10.5)

## 2017-08-28 LAB — GLUCOSE, CAPILLARY
GLUCOSE-CAPILLARY: 98 mg/dL (ref 70–99)
Glucose-Capillary: 90 mg/dL (ref 70–99)
Glucose-Capillary: 95 mg/dL (ref 70–99)
Glucose-Capillary: 95 mg/dL (ref 70–99)

## 2017-08-28 MED ORDER — HYDROMORPHONE HCL 1 MG/ML IJ SOLN: 0.5000 mg | INTRAMUSCULAR | Status: DC | PRN

## 2017-08-28 MED ORDER — OXYCODONE HCL 5 MG PO TABS
5.0000 mg | ORAL_TABLET | ORAL | Status: DC | PRN
  Administered 2017-08-28: 5 mg via ORAL
  Filled 2017-08-28: qty 1

## 2017-08-28 MED ORDER — OXYCODONE HCL 5 MG PO TABS
5.0000 mg | ORAL_TABLET | ORAL | Status: DC | PRN
  Administered 2017-08-28 – 2017-08-30 (×7): 10 mg via ORAL
  Filled 2017-08-28 (×7): qty 2

## 2017-08-28 NOTE — Progress Notes (Signed)
[facility] Progress Note     Subjective: CC: abdominal pain Pain better at rest but still having RLQ pain with certain movements/palpation. Denies nausea and tolerating CLD. Wants to eat. Feels better overall.   Objective: Vital signs in last 24 hours: Temp:  [98.3 F (36.8 C)-98.6 F (37 C)] 98.3 F (36.8 C) (06/25 0423) Pulse Rate:  [58-76] 59 (06/25 0423) Resp:  [18-19] 19 (06/25 0423) BP: (119-155)/(70-89) 137/83 (06/25 0423) SpO2:  [97 %-100 %] 99 % (06/25 0423) Weight:  [69.6 kg (153 lb 8 oz)] 69.6 kg (153 lb 8 oz) (06/24 2024) Last BM Date: 08/27/17  Intake/Output from previous day: 06/24 0701 - 06/25 0700 In: 1007.5 [P.O.:360; I.V.:547.4; IV Piggyback:100.1] Out: -  Intake/Output this shift: No intake/output data recorded.  PE: Gen:  Alert, NAD, pleasant Card:  Regular rate and rhythm, pedal pulses 2+ BL Pulm:  Normal effort, clear to auscultation bilaterally Abd: Soft, TTP in RLQ, no rebound or guarding, non-distended, bowel sounds present, no HSM Skin: warm and dry, no rashes  Psych: A&Ox3   Lab Results:  Recent Labs    08/27/17 1255 08/28/17 0529  WBC 10.7* 10.2  HGB 12.1* 11.6*  HCT 37.4* 36.1*  PLT 264 256   BMET Recent Labs    08/27/17 1255 08/28/17 0529  NA 140 138  K 4.8 4.1  CL 104 104  CO2 28 27  GLUCOSE 93 98  BUN 14 11  CREATININE 1.24 1.30*  CALCIUM 9.4 9.0   PT/INR No results for input(s): LABPROT, INR in the last 72 hours. CMP     Component Value Date/Time   NA 138 08/28/2017 0529   K 4.1 08/28/2017 0529   CL 104 08/28/2017 0529   CO2 27 08/28/2017 0529   GLUCOSE 98 08/28/2017 0529   BUN 11 08/28/2017 0529   CREATININE 1.30 (H) 08/28/2017 0529   CALCIUM 9.0 08/28/2017 0529   PROT 7.0 08/27/2017 1255   ALBUMIN 3.7 08/27/2017 1255   AST 21 08/27/2017 1255   ALT 16 (L) 08/27/2017 1255   ALKPHOS 61 08/27/2017 1255   BILITOT 0.6 08/27/2017 1255   GFRNONAA 60 (L) 08/28/2017 0529   GFRAA >60 08/28/2017 0529    Lipase     Component Value Date/Time   LIPASE 79 (H) 08/27/2017 1255       Studies/Results: Ct Abdomen Pelvis W Contrast  Result Date: 08/27/2017 CLINICAL DATA:  Lower abdominal pain. EXAM: CT ABDOMEN AND PELVIS WITH CONTRAST TECHNIQUE: Multidetector CT imaging of the abdomen and pelvis was performed using the standard protocol following bolus administration of intravenous contrast. CONTRAST:  OMNIPAQUE IOHEXOL 300 MG/ML  SOLN COMPARISON:  None. FINDINGS: Lower chest: No acute abnormality. Hepatobiliary: No focal liver abnormality is seen. No gallstones, gallbladder wall thickening, or biliary dilatation. Pancreas: Unremarkable. No pancreatic ductal dilatation or surrounding inflammatory changes. Spleen: Normal in size without focal abnormality. Adrenals/Urinary Tract: Normal adrenal glands. No urolithiasis or obstructive uropathy. 2.7 x 2.5 cm hypodense, fluid attenuating left renal mass most consistent with a cyst. No solid renal mass. Normal bladder. Stomach/Bowel: Stomach is within normal limits. No evidence of bowel wall thickening, distention, or inflammatory changes. No normal nor abnormal appendix is identified. 2.5 x 3.3 cm complex fluid collection in the right lower quadrant which is difficult to distinguish from fluid-filled loops of adjacent small bowel. Vascular/Lymphatic: No significant vascular findings are present. No enlarged abdominal or pelvic lymph nodes. Reproductive: Prostate is unremarkable. Other: No abdominal wall hernia or abnormality. No abdominopelvic  ascites. Musculoskeletal: No acute or significant osseous findings. 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 Severe bilateral facet arthropathy at L5-S1. Bilateral facet arthropathy throughout the lumbar spine. IMPRESSION: 1. No normal nor abnormal appendix is identified. 2.5 x 3.3 cm complex fluid collection in the right lower quadrant which is difficult to distinguish from fluid-filled loops of  adjacent small bowel. The appearance is concerning for an abscess which may be secondary to appendicitis. The lack of oral contrast material limits evaluation. If there is further clinical concern, recommend repeat CT abdomen and pelvis with oral contrast. Electronically Signed   By: Elige KoHetal  Patel   On: 08/27/2017 16:32    Anti-infectives: Anti-infectives (From admission, onward)   Start     Dose/Rate Route Frequency Ordered Stop   08/27/17 2330  piperacillin-tazobactam (ZOSYN) IVPB 3.375 g     3.375 g 12.5 mL/hr over 240 Minutes Intravenous Every 8 hours 08/27/17 2236     08/27/17 1815  piperacillin-tazobactam (ZOSYN) IVPB 3.375 g     3.375 g 100 mL/hr over 30 Minutes Intravenous  Once 08/27/17 1811 08/27/17 1904       Assessment/Plan T2DM - SSI Arthritis Recent R hip replacement - 1 month ago   RLQ pain, perforated appendicitis - WBC 10.2 from 10.7, afebrile - TTP in RLQ but no peritonitis - continue CLD and IV abx  FEN: CLD, IVF VTE: SCDs, lovenox ID: [id number] 6/24>>  LOS: 1 day    Wells GuilesKelly Rayburn , [facility] 08/28/2017, 8:39 AM Pager: [phone] Consults: [phone] Mon-Fri 7:00 am-4:30 pm Sat-Sun 7:00 am-11:30 am

## 2017-08-29 LAB — BASIC METABOLIC PANEL
Anion gap: 7 (ref 5–15)
BUN: 6 mg/dL (ref 6–20)
CO2: 27 mmol/L (ref 22–32)
Calcium: 8.7 mg/dL — ABNORMAL LOW (ref 8.9–10.3)
Chloride: 105 mmol/L (ref 98–111)
Creatinine, Ser: 1.29 mg/dL — ABNORMAL HIGH (ref 0.61–1.24)
GFR calc Af Amer: >60 mL/min (ref >60)
GLUCOSE: 89 mg/dL (ref 70–99)
POTASSIUM: 4 mmol/L (ref 3.5–5.1)
Sodium: 139 mmol/L (ref 135–145)

## 2017-08-29 LAB — GLUCOSE, CAPILLARY
Glucose-Capillary: 82 mg/dL (ref 70–99)
Glucose-Capillary: 88 mg/dL (ref 70–99)
Glucose-Capillary: 83 mg/dL (ref 70–99)
Glucose-Capillary: 103 mg/dL — ABNORMAL HIGH (ref 70–99)

## 2017-08-29 LAB — CBC
HCT: 34.1 % — ABNORMAL LOW (ref 39.0–52.0)
Hemoglobin: 11.3 g/dL — ABNORMAL LOW (ref 13.0–17.0)
MCH: 28 pg (ref 26.0–34.0)
MCHC: 33.1 g/dL (ref 30.0–36.0)
MCV: 84.6 fL (ref 78.0–100.0)
PLATELETS: 255 10*3/uL (ref 150–400)
RBC: 4.03 MIL/uL — AB (ref 4.22–5.81)
RDW: 13.3 % (ref 11.5–15.5)
WBC: 8.9 10*3/uL (ref 4.0–10.5)

## 2017-08-29 MED ORDER — AMOXICILLIN-POT CLAVULANATE 875-125 MG PO TABS
1.0000 | ORAL_TABLET | Freq: Two times a day (BID) | ORAL | Status: DC
  Administered 2017-08-29 – 2017-08-30 (×2): 1 via ORAL
  Filled 2017-08-29 (×3): qty 1

## 2017-08-29 NOTE — Progress Notes (Deleted)
At approximately 12:30 PM Pt stated he needed to get up to go to bathroom , Pt got up to BSC, during his bowel movement, Pt was noted to be unresponsive, eyes fixed to the right, his arms were fixed at a 90 degree angle and shaking.  Pt started drooling and his body was shaking. The wife started to call out to the patient, I then called to the the patient and no response.  I tried light sternal rub, and no response The episode lasted for approximately 1 minute. Pt slowly started then to answer us, and indicated that he was trying to have a bowel movement.  When asked if he heard his wife or myself, he indicated he did not.  Pt was placed back into bed and v/s were taken, prior to the 15 minute post check - no change in v/s. Dr. Choi was notified of the incident. And orders received.  Pt to be transferred to step down unit. I spoke to Kelly Rayburn, PA for surgery, when I saw her on the unit, advised her of the situation, and the transferring of the patient as well.   

## 2017-08-29 NOTE — Progress Notes (Signed)
[facility] Progress Note     Subjective: CC: RLQ pain Patient still having intermittent pain in RLQ that feels like gas pains. Also reports pain when someone pushes in RLQ. Denies nausea. Had a BM, non-bloody and not diarrhea. Patient wanting to go outside. Patient reports he has been doing push-ups in his room.   Objective: Vital signs in last 24 hours: Temp:  [97.9 F (36.6 C)-98.5 F (36.9 C)] 98.2 F (36.8 C) (06/26 0607) Pulse Rate:  [59-66] 63 (06/26 0607) Resp:  [18] 18 (06/26 0607) BP: (121-136)/(55-91) 121/55 (06/26 0607) SpO2:  [95 %-100 %] 95 % (06/26 0607) Last BM Date: 08/27/17  Intake/Output from previous day: 06/25 0701 - 06/26 0700 In: 2485.6 [P.O.:600; I.V.:1676.4; IV Piggyback:209.3] Out: -  Intake/Output this shift: No intake/output data recorded.  PE: Gen:  Alert, NAD Card:  Regular rate and rhythm, pedal pulses 2+ BL Pulm:  Normal effort, clear to auscultation bilaterally Abd: Soft, TTP in RLQ, no rebound or guarding, non-distended, bowel sounds present, no HSM Skin: warm and dry, no rashes  Psych: A&Ox3   Lab Results:  Recent Labs    08/28/17 0529 08/29/17 0433  WBC 10.2 8.9  HGB 11.6* 11.3*  HCT 36.1* 34.1*  PLT 256 255   BMET Recent Labs    08/28/17 0529 08/29/17 0433  NA 138 139  K 4.1 4.0  CL 104 105  CO2 27 27  GLUCOSE 98 89  BUN 11 6  CREATININE 1.30* 1.29*  CALCIUM 9.0 8.7*   PT/INR No results for input(s): LABPROT, INR in the last 72 hours. CMP     Component Value Date/Time   NA 139 08/29/2017 0433   K 4.0 08/29/2017 0433   CL 105 08/29/2017 0433   CO2 27 08/29/2017 0433   GLUCOSE 89 08/29/2017 0433   BUN 6 08/29/2017 0433   CREATININE 1.29 (H) 08/29/2017 0433   CALCIUM 8.7 (L) 08/29/2017 0433   PROT 7.0 08/27/2017 1255   ALBUMIN 3.7 08/27/2017 1255   AST 21 08/27/2017 1255   ALT 16 (L) 08/27/2017 1255   ALKPHOS 61 08/27/2017 1255   BILITOT 0.6 08/27/2017 1255   GFRNONAA >60 08/29/2017 0433    GFRAA >60 08/29/2017 0433   Lipase     Component Value Date/Time   LIPASE 79 (H) 08/27/2017 1255       Studies/Results: Ct Abdomen Pelvis W Contrast  Result Date: 08/27/2017 CLINICAL DATA:  Lower abdominal pain. EXAM: CT ABDOMEN AND PELVIS WITH CONTRAST TECHNIQUE: Multidetector CT imaging of the abdomen and pelvis was performed using the standard protocol following bolus administration of intravenous contrast. CONTRAST:  OMNIPAQUE IOHEXOL 300 MG/ML  SOLN COMPARISON:  None. FINDINGS: Lower chest: No acute abnormality. Hepatobiliary: No focal liver abnormality is seen. No gallstones, gallbladder wall thickening, or biliary dilatation. Pancreas: Unremarkable. No pancreatic ductal dilatation or surrounding inflammatory changes. Spleen: Normal in size without focal abnormality. Adrenals/Urinary Tract: Normal adrenal glands. No urolithiasis or obstructive uropathy. 2.7 x 2.5 cm hypodense, fluid attenuating left renal mass most consistent with a cyst. No solid renal mass. Normal bladder. Stomach/Bowel: Stomach is within normal limits. No evidence of bowel wall thickening, distention, or inflammatory changes. No normal nor abnormal appendix is identified. 2.5 x 3.3 cm complex fluid collection in the right lower quadrant which is difficult to distinguish from fluid-filled loops of adjacent small bowel. Vascular/Lymphatic: No significant vascular findings are present. No enlarged abdominal or pelvic lymph nodes. Reproductive: Prostate is unremarkable. Other: No abdominal wall hernia  or abnormality. No abdominopelvic ascites. Musculoskeletal: No acute or significant osseous findings. 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 Severe bilateral facet arthropathy at L5-S1. Bilateral facet arthropathy throughout the lumbar spine. IMPRESSION: 1. No normal nor abnormal appendix is identified. 2.5 x 3.3 cm complex fluid collection in the right lower quadrant which is difficult to distinguish  from fluid-filled loops of adjacent small bowel. The appearance is concerning for an abscess which may be secondary to appendicitis. The lack of oral contrast material limits evaluation. If there is further clinical concern, recommend repeat CT abdomen and pelvis with oral contrast. Electronically Signed   By: Elige KoHetal  Patel   On: 08/27/2017 16:32    Anti-infectives: Anti-infectives (From admission, onward)   Start     Dose/Rate Route Frequency Ordered Stop   08/27/17 2330  piperacillin-tazobactam (ZOSYN) IVPB 3.375 g     3.375 g 12.5 mL/hr over 240 Minutes Intravenous Every 8 hours 08/27/17 2236     08/27/17 1815  piperacillin-tazobactam (ZOSYN) IVPB 3.375 g     3.375 g 100 mL/hr over 30 Minutes Intravenous  Once 08/27/17 1811 08/27/17 1904       Assessment/Plan T2DM - SSI Arthritis Recent R hip replacement - 1 month ago   RLQ pain, perforated appendicitis - WBC 8.9, afebrile - continue IV abx - TTP in RLQ but no peritonitis - patient tolerating CLD and having bowel function - advance to FLD  FEN: FLD, IVF VTE: SCDs, lovenox ID: [id number] 6/24>>    LOS: 2 days    Wells GuilesKelly Rayburn , [facility] 08/29/2017, 9:24 AM Pager: [phone] Consults: [phone] Mon-Fri 7:00 am-4:30 pm Sat-Sun 7:00 am-11:30 am

## 2017-08-30 LAB — BASIC METABOLIC PANEL
ANION GAP: 6 (ref 5–15)
BUN: 7 mg/dL (ref 6–20)
CALCIUM: 8.8 mg/dL — AB (ref 8.9–10.3)
CO2: 27 mmol/L (ref 22–32)
CREATININE: 1.2 mg/dL (ref 0.61–1.24)
Chloride: 105 mmol/L (ref 98–111)
GFR calc Af Amer: >60 mL/min (ref >60)
GFR calc non Af Amer: >60 mL/min (ref >60)
Glucose, Bld: 92 mg/dL (ref 70–99)
Potassium: 4.1 mmol/L (ref 3.5–5.1)
Sodium: 138 mmol/L (ref 135–145)

## 2017-08-30 LAB — CBC
HEMATOCRIT: 35.5 % — AB (ref 39.0–52.0)
Hemoglobin: 11.6 g/dL — ABNORMAL LOW (ref 13.0–17.0)
MCH: 27.5 pg (ref 26.0–34.0)
MCHC: 32.7 g/dL (ref 30.0–36.0)
MCV: 84.1 fL (ref 78.0–100.0)
Platelets: 257 10*3/uL (ref 150–400)
RBC: 4.22 MIL/uL (ref 4.22–5.81)
RDW: 13.1 % (ref 11.5–15.5)
WBC: 7.2 10*3/uL (ref 4.0–10.5)

## 2017-08-30 LAB — GLUCOSE, CAPILLARY: Glucose-Capillary: 88 mg/dL (ref 70–99)

## 2017-08-30 MED ORDER — OXYCODONE HCL 5 MG PO TABS: 5.0000 mg | ORAL_TABLET | Freq: Four times a day (QID) | ORAL | 0 refills | Status: DC | PRN

## 2017-08-30 MED ORDER — AMOXICILLIN-POT CLAVULANATE 875-125 MG PO TABS: 1.0000 | ORAL_TABLET | Freq: Two times a day (BID) | ORAL | 0 refills | Status: AC

## 2017-08-30 MED ORDER — ACETAMINOPHEN 325 MG PO TABS: 1000.0000 mg | ORAL_TABLET | Freq: Four times a day (QID) | ORAL | Status: DC | PRN

## 2017-08-30 MED ORDER — METHOCARBAMOL 500 MG PO TABS: 500.0000 mg | ORAL_TABLET | Freq: Four times a day (QID) | ORAL | 0 refills | Status: DC

## 2017-08-30 NOTE — Progress Notes (Signed)
Discharge instructions reviewed with Pt and his sister (who was on the phone)  Pt given instructions and to call [facility] for appointment. Pt know he needs to pick up his prescriptions Pt did not need work excuse, since he is unemployed. Pt left ambulatory to front, to meet his family. Pt discharged home.

## 2017-08-30 NOTE — Discharge Instructions (Signed)
Appendicitis °The appendix is a tube that is shaped like a finger. It is connected to the large intestine. Appendicitis means that this tube is swollen (inflamed). Without treatment, the tube can tear (rupture). This can lead to a life-threatening infection. It can also cause you to have sores (abscesses). These sores hurt. °What are the causes? °This condition may be caused by something that blocks the appendix, such as: °· A ball of poop (stool). °· Lymph glands that are bigger than normal. ° °Sometimes, the cause is not known. °What are the signs or symptoms? °Symptoms of this condition include: °· Pain around the belly button (navel). °? The pain moves toward the lower right belly (abdomen). °? The pain can get worse with time. °? The pain can get worse if you cough. °? The pain can get worse if you move suddenly. °· Tenderness in the lower right belly. °· Feeling sick to your stomach (nauseous). °· Throwing up (vomiting). °· Not feeling hungry (loss of appetite). °· A fever. °· Having a hard time pooping (constipation). °· Watery poop (diarrhea). °· Not feeling well. ° °How is this treated? °Usually, this condition is treated by taking out the appendix (appendectomy). There are two ways that the appendix can be taken out: °· Open surgery. In this surgery, the appendix is taken out through a large cut (incision). The cut is made in the lower right belly. This surgery may be picked if: °? You have scars from another surgery. °? You have a bleeding condition. °? You are pregnant and will be having your baby soon. °? You have a condition that does not allow the other type of surgery. °· Laparoscopic surgery. In this surgery, the appendix is taken out through small cuts. Often, this surgery: °? Causes less pain. °? Causes fewer problems. °? Is easier to heal from. ° °If your appendix tears and a sore forms: °· A drain may be put into the sore. The drain will be used to get rid of fluid. °· You may get an antibiotic  medicine through an IV tube. °· Your appendix may or may not need to be taken out. ° °This information is not intended to replace advice given to you by your health care provider. Make sure you discuss any questions you have with your health care provider. °Document Released: 05/15/2011 Document Revised: 07/29/2015 Document Reviewed: 07/08/2014 °Elsevier Interactive Patient Education © 2018 Elsevier Inc. ° °

## 2017-08-30 NOTE — Discharge Summary (Signed)
[facility] Discharge Summary   Patient ID: Keith PelDewey H Vaughn MRN: [id number] DOB/AGE: June 30, 1960 57 y.o.  Admit date: 08/27/2017 Discharge date: 08/30/2017  Admitting Diagnosis: Perforated appendicitis  Discharge Diagnosis Patient Active Problem List   Diagnosis Date Noted  . Acute perforated appendicitis 08/27/2017  . Unilateral primary osteoarthritis, right hip 07/10/2017  . Status post total replacement of right hip 07/10/2017  . Osteoarthritis of left hip 11/16/2015  . Status post left hip replacement 11/16/2015    Consultants None  Imaging: No results found.  Procedures None  Hospital Course:  Patient is a 57 year old male who [facility] with 5 day history of RLQ pain.  Workup showed perforated appendicitis.  Patient was admitted and treated with IV antibiotics. Diet was advanced as tolerated.   On 08/30/17, the patient was voiding well, tolerating diet, ambulating well, pain well controlled, vital signs stable, transitioned to PO antibiotics and felt stable for discharge home.  Patient will follow up in our office in 2-3 weeks and knows to call with questions or concerns.  He will call to confirm appointment date/time.    Physical Exam: Gen: Alert, NAD Card: Regular rate and rhythm, pedal pulses 2+ BL Pulm: Normal effort, clear to auscultation bilaterally Abd: Soft,non-tender, no rebound or guarding, non-distended, bowel sounds present, no HSM Skin: warm and dry, no rashes  Psych: A&Ox3   I have personally looked this patient up in the [place] Controlled Substance Database and reviewed their medications.  Allergies as of 08/30/2017   No Known Allergies     Medication List    STOP taking these medications   aspirin 81 MG chewable tablet   HYDROcodone-acetaminophen 5-325 MG tablet Commonly known as:  NORCO/VICODIN     TAKE these medications   acetaminophen 325 MG tablet Commonly known as:  TYLENOL Take 3 tablets (975 mg total) by mouth  every 6 (six) hours as needed for mild pain (or temp > 100).   amoxicillin-clavulanate 875-125 MG tablet Commonly known as:  AUGMENTIN Take 1 tablet by mouth every 12 (twelve) hours for 10 days.   metFORMIN 500 MG tablet Commonly known as:  GLUCOPHAGE Take 500 mg by mouth 2 (two) times daily.   methocarbamol 500 MG tablet Commonly known as:  ROBAXIN Take 1 tablet (500 mg total) by mouth every 6 (six) hours as needed for muscle spasms. What changed:  Another medication with the same name was added. Make sure you understand how and when to take each.   methocarbamol 500 MG tablet Commonly known as:  ROBAXIN Take 1 tablet (500 mg total) by mouth 4 (four) times daily. What changed:  You were already taking a medication with the same name, and this prescription was added. Make sure you understand how and when to take each.   oxyCODONE 5 MG immediate release tablet Commonly known as:  Oxy IR/ROXICODONE Take 1-2 tablets (5-10 mg total) by mouth every 6 (six) hours as needed for moderate pain (pain score 4-6). What changed:  Another medication with the same name was added. Make sure you understand how and when to take each.   oxyCODONE 5 MG immediate release tablet Commonly known as:  Oxy IR/ROXICODONE Take 1 tablet (5 mg total) by mouth every 6 (six) hours as needed for moderate pain or severe pain. What changed:  You were already taking a medication with the same name, and this prescription was added. Make sure you understand how and when to take each.  Follow-up Information    [name], MD. Call.   Specialty:  General Surgery Why:  Call and schedule a follow up appointment for 2-3 weeks. Please arrive 30 min prior to appointment time. Bring photo ID and insurance information.  Contact information: [address] [place] [address] [phone]           Signed: Wells Guiles, [facility] 08/30/2017, 8:31 AM Pager:  [phone] Consults: [phone] Mon-Fri 7:00 am-4:30 pm Sat-Sun 7:00 am-11:30 am

## 2017-09-13 ENCOUNTER — Telehealth (INDEPENDENT_AMBULATORY_CARE_PROVIDER_SITE_OTHER): Payer: Self-pay | Admitting: Orthopaedic Surgery

## 2017-09-13 MED ORDER — HYDROCODONE-ACETAMINOPHEN 5-325 MG PO TABS: 1.0000 | ORAL_TABLET | Freq: Four times a day (QID) | ORAL | 0 refills | Status: DC | PRN

## 2017-09-13 NOTE — Telephone Encounter (Signed)
Since his been over 2 months since surgery, medicine and hydrocodone instead of oxycodone.  He will have to take this and waning over the next month.

## 2017-09-13 NOTE — Telephone Encounter (Signed)
Patient aware of the below message  

## 2017-09-13 NOTE — Telephone Encounter (Signed)
Please advise 

## 2017-09-13 NOTE — Telephone Encounter (Signed)
Patient called needing refill on his pain medicine. The number to contact patient is [phone]

## 2017-09-20 ENCOUNTER — Other Ambulatory Visit: Payer: Self-pay | Admitting: Student

## 2017-09-20 DIAGNOSIS — R109 Unspecified abdominal pain: Secondary | ICD-10-CM

## 2017-09-24 ENCOUNTER — Emergency Department (HOSPITAL_COMMUNITY): Payer: Medicaid Other

## 2017-09-24 ENCOUNTER — Emergency Department (HOSPITAL_COMMUNITY)
Admission: EM | Admit: 2017-09-24 | Discharge: 2017-09-24 | Disposition: A | Discharge status: Home / Self Care | Payer: Medicaid Other | Attending: Emergency Medicine | Admitting: Emergency Medicine

## 2017-09-24 ENCOUNTER — Encounter (HOSPITAL_COMMUNITY): Payer: Self-pay | Admitting: *Deleted

## 2017-09-24 DIAGNOSIS — E119 Type 2 diabetes mellitus without complications: Secondary | ICD-10-CM | POA: Insufficient documentation

## 2017-09-24 DIAGNOSIS — R1031 Right lower quadrant pain: Secondary | ICD-10-CM | POA: Diagnosis not present

## 2017-09-24 DIAGNOSIS — Z7984 Long term (current) use of oral hypoglycemic drugs: Secondary | ICD-10-CM | POA: Insufficient documentation

## 2017-09-24 DIAGNOSIS — R109 Unspecified abdominal pain: Secondary | ICD-10-CM | POA: Diagnosis present

## 2017-09-24 DIAGNOSIS — F17210 Nicotine dependence, cigarettes, uncomplicated: Secondary | ICD-10-CM | POA: Diagnosis not present

## 2017-09-24 LAB — URINALYSIS, ROUTINE W REFLEX MICROSCOPIC
BACTERIA UA: NONE SEEN
Bilirubin Urine: NEGATIVE
Glucose, UA: NEGATIVE mg/dL
KETONES UR: NEGATIVE mg/dL
LEUKOCYTES UA: NEGATIVE
Nitrite: NEGATIVE
PROTEIN: NEGATIVE mg/dL
Specific Gravity, Urine: 1.014 (ref 1.005–1.030)
pH: 6 (ref 5.0–8.0)

## 2017-09-24 LAB — COMPREHENSIVE METABOLIC PANEL
ALT: 17 U/L (ref 0–44)
ANION GAP: 9 (ref 5–15)
AST: 26 U/L (ref 15–41)
Albumin: 4.2 g/dL (ref 3.5–5.0)
Alkaline Phosphatase: 54 U/L (ref 38–126)
BUN: 12 mg/dL (ref 6–20)
CHLORIDE: 106 mmol/L (ref 98–111)
CO2: 27 mmol/L (ref 22–32)
CREATININE: 1.21 mg/dL (ref 0.61–1.24)
Calcium: 9.6 mg/dL (ref 8.9–10.3)
GFR calc non Af Amer: >60 mL/min (ref >60)
Glucose, Bld: 96 mg/dL (ref 70–99)
POTASSIUM: 4.2 mmol/L (ref 3.5–5.1)
SODIUM: 142 mmol/L (ref 135–145)
Total Bilirubin: 0.6 mg/dL (ref 0.3–1.2)
Total Protein: 7 g/dL (ref 6.5–8.1)

## 2017-09-24 LAB — CBC
HEMATOCRIT: 41.7 % (ref 39.0–52.0)
HEMOGLOBIN: 13.4 g/dL (ref 13.0–17.0)
MCH: 27.9 pg (ref 26.0–34.0)
MCHC: 32.1 g/dL (ref 30.0–36.0)
MCV: 86.7 fL (ref 78.0–100.0)
Platelets: 252 10*3/uL (ref 150–400)
RBC: 4.81 MIL/uL (ref 4.22–5.81)
RDW: 14.6 % (ref 11.5–15.5)
WBC: 6.5 10*3/uL (ref 4.0–10.5)

## 2017-09-24 LAB — LIPASE, BLOOD: LIPASE: 44 U/L (ref 11–51)

## 2017-09-24 MED ORDER — IOHEXOL 300 MG/ML  SOLN
100.0000 mL | Freq: Once | INTRAMUSCULAR | Status: AC | PRN
  Administered 2017-09-24: 100 mL via INTRAVENOUS

## 2017-09-24 MED ORDER — HYDROCODONE-ACETAMINOPHEN 5-325 MG PO TABS
1.0000 | ORAL_TABLET | Freq: Once | ORAL | Status: AC
  Administered 2017-09-24: 1 via ORAL
  Filled 2017-09-24: qty 1

## 2017-09-24 NOTE — ED Triage Notes (Signed)
Pt in c/o abdominal pain for the last two days and vomited last night, states he was recently admitted for a ruptured appendix and was discharge three weeks ago, states they were unable to do the surgery to remove his appendix, pt just completed PO antibiotics at home but was told to return if pain returned

## 2017-09-24 NOTE — ED Notes (Signed)
Patient transported to CT 

## 2017-09-24 NOTE — ED Provider Notes (Signed)
[facility] EMERGENCY DEPARTMENT Provider Note   CSN: [id number] Arrival date & time: 09/24/17  1105     History   Chief Complaint Chief Complaint  Patient presents with  . Abdominal Pain    HPI HELEN CUFF is a 57 y.o. male.  The history is provided by the patient.  Abdominal Pain   This is a recurrent problem. The current episode started 2 days ago. The problem occurs constantly. The problem has been gradually worsening. The pain is associated with an unknown factor. The pain is located in the RLQ. The quality of the pain is dull. The pain is at a severity of 3/10. The pain is mild. Associated symptoms include nausea and vomiting. Pertinent negatives include anorexia, fever, diarrhea, melena, constipation and dysuria. Associated symptoms comments: Normal bowel movement today.  Only 1 episode of vomiting yesterday but nothing currently. Nothing aggravates the symptoms. Nothing relieves the symptoms. Past medical history comments: perforated appy 4 weeks ago treated conservatively with abx..    Past Medical History:  Diagnosis Date  . Arthritis    "was in my hips" (08/28/2017)  . Type II diabetes mellitus [facility])     Patient Active Problem List   Diagnosis Date Noted  . Acute perforated appendicitis 08/27/2017  . Unilateral primary osteoarthritis, right hip 07/10/2017  . Status post total replacement of right hip 07/10/2017  . Osteoarthritis of left hip 11/16/2015  . Status post left hip replacement 11/16/2015    Past Surgical History:  Procedure Laterality Date  . CLOSED REDUCTION HAND FRACTURE Right    bolts and screws  . FRACTURE SURGERY    . JOINT REPLACEMENT    . TONSILLECTOMY    . TOTAL HIP ARTHROPLASTY Left 11/16/2015   Procedure: LEFT TOTAL HIP ARTHROPLASTY ANTERIOR APPROACH;  Surgeon: Kathryne Hitch, MD;  Location: [facility] OR;  Service: Orthopedics;  Laterality: Left;  . TOTAL HIP ARTHROPLASTY Right 07/10/2017   Procedure: RIGHT TOTAL HIP  ARTHROPLASTY ANTERIOR APPROACH;  Surgeon: Kathryne Hitch, MD;  Location: [facility] OR;  Service: Orthopedics;  Laterality: Right;        Home Medications    Prior to Admission medications   Medication Sig Start Date End Date Taking? Authorizing Provider  acetaminophen (TYLENOL) 325 MG tablet Take 3 tablets (975 mg total) by mouth every 6 (six) hours as needed for mild pain (or temp > 100). 08/30/17  Yes Rayburn, Alphonsus Sias, PA-C  HYDROcodone-acetaminophen (NORCO/VICODIN) 5-325 MG tablet Take 1-2 tablets by mouth every 6 (six) hours as needed for moderate pain. 09/13/17  Yes Kathryne Hitch, MD  metFORMIN (GLUCOPHAGE) 500 MG tablet Take 500 mg by mouth 2 (two) times daily.  12/10/15   [provider]  methocarbamol (ROBAXIN) 500 MG tablet Take 1 tablet (500 mg total) by mouth every 6 (six) hours as needed for muscle spasms. 08/22/17   Kathryne Hitch, MD  methocarbamol (ROBAXIN) 500 MG tablet Take 1 tablet (500 mg total) by mouth 4 (four) times daily. 08/30/17   Rayburn, Alphonsus Sias, PA-C  oxyCODONE (OXY IR/ROXICODONE) 5 MG immediate release tablet Take 1-2 tablets (5-10 mg total) by mouth every 6 (six) hours as needed for moderate pain (pain score 4-6). 08/16/17   Kirtland Bouchard, PA-C  oxyCODONE (OXY IR/ROXICODONE) 5 MG immediate release tablet Take 1 tablet (5 mg total) by mouth every 6 (six) hours as needed for moderate pain or severe pain. 08/30/17   Rayburn, Alphonsus Sias, PA-C    Family History History  reviewed. No pertinent family history.  Social History Social History   Tobacco Use  . Smoking status: Current Every Day Smoker    Packs/day: 0.50    Years: 40.00    Pack years: 20.00    Types: Cigarettes  . Smokeless tobacco: Never Used  Substance Use Topics  . Alcohol use: No    Comment: 08/28/2017 "nothing since 2011"  . Drug use: Yes    Types: "Crack" cocaine, Cocaine, Marijuana    Comment: 08/28/2017 "smoke marijuana qd; no other drugs since 2011"      Allergies   Patient has no known allergies.   Review of Systems Review of Systems  Constitutional: Negative for fever.  Gastrointestinal: Positive for abdominal pain, nausea and vomiting. Negative for anorexia, constipation, diarrhea and melena.  Genitourinary: Negative for dysuria.  All other systems reviewed and are negative.    Physical Exam Updated Vital Signs BP 138/75   Pulse (!) 55   Temp 98.1 F (36.7 C) (Oral)   Resp 19   SpO2 99%   Physical Exam  Constitutional: He is oriented to person, place, and time. He appears well-developed and well-nourished. No distress.  HENT:  Head: Normocephalic and atraumatic.  Mouth/Throat: Oropharynx is clear and moist.  Eyes: Pupils are equal, round, and reactive to light. Conjunctivae and EOM are normal.  Neck: Normal range of motion. Neck supple.  Cardiovascular: Normal rate, regular rhythm and intact distal pulses.  No murmur heard. Pulmonary/Chest: Effort normal and breath sounds normal. No respiratory distress. He has no wheezes. He has no rales.  Abdominal: Soft. He exhibits no distension. There is tenderness in the right lower quadrant. There is no rebound and no guarding.  Musculoskeletal: Normal range of motion. He exhibits no edema or tenderness.  Neurological: He is alert and oriented to person, place, and time.  Skin: Skin is warm and dry. No rash noted. No erythema.  Psychiatric: He has a normal mood and affect. His behavior is normal.  Nursing note and vitals reviewed.    ED Treatments / Results  Labs (all labs ordered are listed, but only abnormal results are displayed) Labs Reviewed  URINALYSIS, ROUTINE W REFLEX MICROSCOPIC - Abnormal; Notable for the following components:      Result Value   Hgb urine dipstick SMALL (*)    All other components within normal limits  LIPASE, BLOOD  COMPREHENSIVE METABOLIC PANEL  CBC    EKG None  Radiology Ct Abdomen Pelvis W Contrast  Result Date:  09/24/2017 CLINICAL DATA:  57 year old male with abdominal pain for the past 2 days with vomiting last night. Recently admitted for ruptured appendix and was discharged 3 weeks ago. Unable to do surgery to remove appendix. Oral antibiotics at home. Subsequent encounter. EXAM: CT ABDOMEN AND PELVIS WITH CONTRAST TECHNIQUE: Multidetector CT imaging of the abdomen and pelvis was performed using the standard protocol following bolus administration of intravenous contrast. CONTRAST:  OMNIPAQUE IOHEXOL 300 MG/ML  SOLN COMPARISON:  08/27/2017 CT. FINDINGS: Lower chest: Subsegmental atelectasis/scarring lung bases. Heart size within normal limits. Hepatobiliary: Motion degradation. No worrisome hepatic lesion detected. Cannot evaluate gallbladder adequately secondary to artifact. Probable artifact rather than right subphrenic fluid collection. Pancreas: Taking into account motion degradation, no pancreatic mass or primary pancreatic inflammation noted. Spleen: Motion degraded without mass or enlargement. Adrenals/Urinary Tract: No obstructing stone or hydronephrosis. Low-density renal structures larger on left. Larger ones are cysts, others too small to adequately characterize but without significant change. No adrenal lesion. Limited evaluation urinary  bladder secondary to artifact caused by hip replacements. Stomach/Bowel: Evaluation limited by motion, lack of fat planes and streak artifact (particularly in the pelvis secondary to hip replacements). Taking these limitations into account, there has been interval clearing of abnormal fluid collection at the level of the appendix. There remains inflammation/thickening of the proximal appendix however the distal appendix has a normal appearance. Circumferential thickening of the sigmoid colon which contains multiple diverticula. This circumferential thickening may be related to under distension and muscular hypertrophy however cannot exclude changes of colitis.  Evaluation of stomach limited by motion artifact and under distension. No gross abnormality noted. Vascular/Lymphatic: No abdominal aortic aneurysm or large vessel occlusion. Atherosclerotic changes right common iliac artery. Scattered small lymph nodes. Reproductive: Evaluation limited by streak artifact from hip replacements. Other: No free air or bowel containing hernia. Musculoskeletal: Post bilateral hip replacement with streak artifact. Degenerative changes throughout the lumbar spine most notable L3-4. Scoliosis convex left IMPRESSION: Evaluation limited by motion, lack of fat planes and streak artifact (particularly in the pelvis secondary to hip replacements). Taking these limitations into account, there has been interval clearing of abnormal fluid collection at the level of the appendix. There remains inflammation/thickening of the proximal appendix however the distal appendix has a normal appearance. Circumferential thickening of the sigmoid colon which contains multiple diverticula. This circumferential thickening may be related to under distension and muscular hypertrophy however cannot exclude changes of colitis. Limited evaluation of gallbladder secondary to motion. These results were called by telephone at the time of interpretation on 09/24/2017 at 2:50 pm to Dr. Gwyneth SproutWHITNEY Willow Shidler , who verbally acknowledged these results. Electronically Signed   By: Lacy DuverneySteven  Olson M.D.   On: 09/24/2017 14:52    Procedures Procedures (including critical care time)  Medications Ordered in ED Medications  iohexol (OMNIPAQUE) 300 MG/ML solution 100 mL (100 mLs Intravenous Contrast Given 09/24/17 1355)     Initial Impression / Assessment and Plan / ED Course  I have reviewed the triage vital signs and the nursing notes.  Pertinent labs & imaging results that were available during my care of the patient were reviewed by me and considered in my medical decision making (see chart for details).     Patient  presenting today with recurrent right lower quadrant discomfort.  Approx 4 weeks ago patient had a perforated appendicitis which was treated conservatively with antibiotics.  Patient finished the course of antibiotics and stated he had been doing well until 2 days ago he started having discomfort in the right lower quadrant again.  He had one episode of vomiting yesterday but denies further vomiting.  Bowel movements have been normal.  Labs are within normal limits and vital signs are normal.  We will do a CT to ensure no development of abscess.  3:44 PM CT with above findings.  Discussed with general surgery who also reviewed the images.  At this time they feel like patient's symptoms are improving as expected.  Because he is not having any fever and has normal white count they did not feel that he needed to start antibiotics again.  They did stress the importance of follow-up with Dr. Dwain SarnaWakefield which I have indicated to the patient.  Otherwise he is safe for discharge home.  He was given return precautions.  Final Clinical Impressions(s) / ED Diagnoses   Final diagnoses:  Right lower quadrant abdominal pain    ED Discharge Orders    None       Gwyneth SproutPlunkett, Earlyne Feeser, MD 09/24/17 1544

## 2017-10-01 ENCOUNTER — Inpatient Hospital Stay
Admission: RE | Admit: 2017-10-01 | Discharge: 2017-10-01 | Disposition: A | Discharge status: Home / Self Care | Payer: Self-pay | Source: Ambulatory Visit | Attending: Student | Admitting: Student

## 2017-10-11 ENCOUNTER — Other Ambulatory Visit: Payer: Self-pay | Admitting: General Surgery

## 2017-10-12 ENCOUNTER — Telehealth (INDEPENDENT_AMBULATORY_CARE_PROVIDER_SITE_OTHER): Payer: Self-pay | Admitting: Orthopaedic Surgery

## 2017-10-12 NOTE — Telephone Encounter (Signed)
IC s/w patient advised would not hear back about whether or not provider ok'd rx until Monday.

## 2017-10-12 NOTE — Telephone Encounter (Signed)
Patient requesting rx refill on hydrocodone. # [phone]

## 2017-10-15 ENCOUNTER — Other Ambulatory Visit (INDEPENDENT_AMBULATORY_CARE_PROVIDER_SITE_OTHER): Payer: Self-pay | Admitting: Physician Assistant

## 2017-10-15 MED ORDER — HYDROCODONE-ACETAMINOPHEN 5-325 MG PO TABS: 1.0000 | ORAL_TABLET | Freq: Four times a day (QID) | ORAL | 0 refills | Status: AC | PRN

## 2017-10-15 NOTE — Telephone Encounter (Signed)
On desk

## 2017-10-15 NOTE — Telephone Encounter (Signed)
Patient aware Rx ready at front desk  

## 2017-10-15 NOTE — Telephone Encounter (Signed)
Please advise 

## 2017-10-23 NOTE — Pre-Procedure Instructions (Signed)
Keith Vaughn  10/23/2017    Your procedure is scheduled on Tuesday, October 30, 2017 at 9:30 AM.   [facility] Admitting Office at 7:30 AM.   Call this number if you have problems the morning of surgery: [phone]   Questions prior to day of surgery, please call [phone] between 8 & 4 PM.   Remember:  Do not eat after midnight Monday, 10/29/17.  You may drink clear liquids until 6:30 AM .  Clear liquids allowed are: Water, Juice (no pulp), Carbonated beverages, clear tea, black coffee, plain Jello, plain popsicles, Gatorade.  Drink the Pre-surgery drink by 6:30 AM.  Do NOT smoke 24 hours prior to surgery.                     Take these medicines the morning of surgery with A SIP OF WATER: Meclizine (Antivert) - if needed, Tylenol or Hydrocodone - if needed  Do not take Metformin (Glucaphage) the morning of surgery.  Do not use NSAIDS (Ibuprofen, Aleve, etc) or Aspirin products prior to surgery.   How to Manage Your Diabetes Before Surgery   Why is it important to control my blood sugar before and after surgery?   Improving blood sugar levels before and after surgery helps healing and can limit problems.  A way of improving blood sugar control is eating a healthy diet by:  - Eating less sugar and carbohydrates  - Increasing activity/exercise  - Talk with your doctor about reaching your blood sugar goals  High blood sugars (greater than 180 mg/dL) can raise your risk of infections and slow down your recovery so you will need to focus on controlling your diabetes during the weeks before surgery.  Make sure that the doctor who takes care of your diabetes knows about your planned surgery including the date and location.  How do I manage my blood sugars before surgery?   Check your blood sugar at least 4 times a day, 2 days before surgery to make sure that they are not too high or low.  Check your blood sugar the morning of your surgery  when you wake up and every 2 hours until you get to the Short-Stay unit.  Treat a low blood sugar (less than 70 mg/dL) with 1/2 cup of clear juice (cranberry or apple), 4 glucose tablets, OR glucose gel.  Recheck blood sugar in 15 minutes after treatment (to make sure it is greater than 70 mg/dL).  If blood sugar is not greater than 70 mg/dL on re-check, call [phone] for further instructions.   Report your blood sugar to the Short-Stay nurse when you get to Short-Stay.  References:  [facility], 2007 "How to Manage your Diabetes Before and After Surgery".    Do not wear jewelry.  Do not wear lotions, powders, cologne or deodorant.  Men may shave face and neck.  Do not bring valuables to the hospital.  [facility] is not responsible for any belongings or valuables.  Contacts, dentures or bridgework may not be worn into surgery.  Leave your suitcase in the car.  After surgery it may be brought to your room.  For patients admitted to the hospital, discharge time will be determined by your treatment team.  Patients discharged the day of surgery will not be allowed to drive home.   [place] - Preparing for Surgery  Before surgery, you can play an important role.  Because skin is  not sterile, your skin needs to be as free of germs as possible.  You can reduce the number of germs on you skin by washing with CHG (chlorahexidine gluconate) soap before surgery.  CHG is an antiseptic cleaner which kills germs and bonds with the skin to continue killing germs even after washing.  Oral Hygiene is also important in reducing the risk of infection.  Remember to brush your teeth with your regular toothpaste the morning of surgery.  Please DO NOT use if you have an allergy to CHG or antibacterial soaps.  If your skin becomes reddened/irritated stop using the CHG and inform your nurse when you arrive at Short Stay.  Do not shave (including legs and underarms) for at least  48 hours prior to the first CHG shower.  You may shave your face.  Please follow these instructions carefully:   1.  Shower with CHG Soap the night before surgery and the morning of Surgery.  2.  If you choose to wash your hair, wash your hair first as usual with your normal shampoo.  3.  After you shampoo, rinse your hair and body thoroughly to remove the shampoo. 4.  Use CHG as you would any other liquid soap.  You can apply chg directly to the skin and wash gently with a      scrungie or washcloth.           5.  Apply the CHG Soap to your body ONLY FROM THE NECK DOWN.   Do not use on open wounds or open sores. Avoid contact with your eyes, ears, mouth and genitals (private parts).  Wash genitals (private parts) with your normal soap.  6.  Wash thoroughly, paying special attention to the area where your surgery will be performed.  7.  Thoroughly rinse your body with warm water from the neck down.  8.  DO NOT shower/wash with your normal soap after using and rinsing off the CHG Soap.  9.  Pat yourself dry with a clean towel.            10.  Wear clean pajamas.            11.  Place clean sheets on your bed the night of your first shower and do not sleep with pets.  Day of Surgery  Shower as above. Do not apply any lotions/deodorants the morning of surgery.   Please wear clean clothes to the hospital. Remember to brush your teeth with toothpaste.   Please read over the fact sheets that you were given.

## 2017-10-24 ENCOUNTER — Other Ambulatory Visit: Payer: Self-pay

## 2017-10-24 ENCOUNTER — Encounter (HOSPITAL_COMMUNITY)
Admission: RE | Admit: 2017-10-24 | Discharge: 2017-10-24 | Disposition: A | Discharge status: Home / Self Care | Payer: Medicaid Other | Source: Ambulatory Visit | Attending: General Surgery | Admitting: General Surgery

## 2017-10-24 ENCOUNTER — Encounter (HOSPITAL_COMMUNITY): Payer: Self-pay

## 2017-10-24 DIAGNOSIS — Z01812 Encounter for preprocedural laboratory examination: Secondary | ICD-10-CM | POA: Diagnosis not present

## 2017-10-24 LAB — CBC
HEMATOCRIT: 41.6 % (ref 39.0–52.0)
Hemoglobin: 13.3 g/dL (ref 13.0–17.0)
MCH: 28.1 pg (ref 26.0–34.0)
MCHC: 32 g/dL (ref 30.0–36.0)
MCV: 87.8 fL (ref 78.0–100.0)
Platelets: 228 10*3/uL (ref 150–400)
RBC: 4.74 MIL/uL (ref 4.22–5.81)
RDW: 14.4 % (ref 11.5–15.5)
WBC: 6 10*3/uL (ref 4.0–10.5)

## 2017-10-24 LAB — BASIC METABOLIC PANEL
Anion gap: 6 (ref 5–15)
BUN: 14 mg/dL (ref 6–20)
CHLORIDE: 109 mmol/L (ref 98–111)
CO2: 27 mmol/L (ref 22–32)
Calcium: 9.1 mg/dL (ref 8.9–10.3)
Creatinine, Ser: 1.27 mg/dL — ABNORMAL HIGH (ref 0.61–1.24)
GFR calc Af Amer: >60 mL/min (ref >60)
GLUCOSE: 68 mg/dL — AB (ref 70–99)
POTASSIUM: 4 mmol/L (ref 3.5–5.1)
Sodium: 142 mmol/L (ref 135–145)

## 2017-10-24 LAB — HEMOGLOBIN A1C
Hgb A1c MFr Bld: 5.5 % (ref 4.8–5.6)
MEAN PLASMA GLUCOSE: 111.15 mg/dL

## 2017-10-24 LAB — GLUCOSE, CAPILLARY: GLUCOSE-CAPILLARY: 94 mg/dL (ref 70–99)

## 2017-10-24 NOTE — Progress Notes (Signed)
Pt denies cardiac history or HTN. Pt is a type 2 diabetic. Pt not sure when last A1C was done. States his fasting blood sugar is usually between 98-100.

## 2017-10-29 MED ORDER — CEFAZOLIN SODIUM-DEXTROSE 2-4 GM/100ML-% IV SOLN
2.0000 g | INTRAVENOUS | Status: AC
  Administered 2017-10-30: 2 g via INTRAVENOUS
  Filled 2017-10-29: qty 100

## 2017-10-29 MED ORDER — METRONIDAZOLE IN NACL 5-0.79 MG/ML-% IV SOLN
500.0000 mg | INTRAVENOUS | Status: AC
  Administered 2017-10-30: 500 mg via INTRAVENOUS
  Filled 2017-10-29: qty 100

## 2017-10-30 ENCOUNTER — Ambulatory Visit (HOSPITAL_COMMUNITY): Payer: Medicaid Other | Admitting: Certified Registered"

## 2017-10-30 ENCOUNTER — Ambulatory Visit (HOSPITAL_COMMUNITY)
Admission: RE | Admit: 2017-10-30 | Discharge: 2017-10-30 | Disposition: A | Discharge status: Home / Self Care | Payer: Medicaid Other | Source: Ambulatory Visit | Attending: General Surgery | Admitting: General Surgery

## 2017-10-30 ENCOUNTER — Encounter (HOSPITAL_COMMUNITY)
Admission: RE | Disposition: A | Discharge status: Home / Self Care | Payer: Self-pay | Source: Ambulatory Visit | Attending: General Surgery

## 2017-10-30 ENCOUNTER — Encounter (HOSPITAL_COMMUNITY): Payer: Self-pay | Admitting: *Deleted

## 2017-10-30 DIAGNOSIS — M1990 Unspecified osteoarthritis, unspecified site: Secondary | ICD-10-CM | POA: Insufficient documentation

## 2017-10-30 DIAGNOSIS — F17210 Nicotine dependence, cigarettes, uncomplicated: Secondary | ICD-10-CM | POA: Insufficient documentation

## 2017-10-30 DIAGNOSIS — K5730 Diverticulosis of large intestine without perforation or abscess without bleeding: Secondary | ICD-10-CM | POA: Insufficient documentation

## 2017-10-30 DIAGNOSIS — K3580 Unspecified acute appendicitis: Secondary | ICD-10-CM | POA: Diagnosis present

## 2017-10-30 DIAGNOSIS — E119 Type 2 diabetes mellitus without complications: Secondary | ICD-10-CM | POA: Diagnosis not present

## 2017-10-30 DIAGNOSIS — Z7984 Long term (current) use of oral hypoglycemic drugs: Secondary | ICD-10-CM | POA: Diagnosis not present

## 2017-10-30 HISTORY — PX: LAPAROSCOPIC APPENDECTOMY: SHX408

## 2017-10-30 LAB — GLUCOSE, CAPILLARY
Glucose-Capillary: 59 mg/dL — ABNORMAL LOW (ref 70–99)
Glucose-Capillary: 81 mg/dL (ref 70–99)
Glucose-Capillary: 104 mg/dL — ABNORMAL HIGH (ref 70–99)

## 2017-10-30 SURGERY — APPENDECTOMY, LAPAROSCOPIC
Anesthesia: Regional | Site: Abdomen

## 2017-10-30 MED ORDER — MORPHINE SULFATE (PF) 2 MG/ML IV SOLN: 1.0000 mg | INTRAVENOUS | Status: DC | PRN

## 2017-10-30 MED ORDER — ACETAMINOPHEN 500 MG PO TABS
1000.0000 mg | ORAL_TABLET | ORAL | Status: AC
  Administered 2017-10-30: 1000 mg via ORAL
  Filled 2017-10-30: qty 2

## 2017-10-30 MED ORDER — BUPIVACAINE-EPINEPHRINE 0.25% -1:200000 IJ SOLN
INTRAMUSCULAR | Status: DC | PRN
  Administered 2017-10-30: 6 mL

## 2017-10-30 MED ORDER — PROPOFOL 10 MG/ML IV BOLUS
INTRAVENOUS | Status: AC
  Filled 2017-10-30: qty 20

## 2017-10-30 MED ORDER — LACTATED RINGERS IV SOLN
INTRAVENOUS | Status: DC
  Administered 2017-10-30: 09:00:00 via INTRAVENOUS

## 2017-10-30 MED ORDER — ROPIVACAINE HCL 7.5 MG/ML IJ SOLN
INTRAMUSCULAR | Status: DC | PRN
  Administered 2017-10-30 (×2): 20 mL via PERINEURAL

## 2017-10-30 MED ORDER — PROPOFOL 10 MG/ML IV BOLUS
INTRAVENOUS | Status: DC | PRN
  Administered 2017-10-30: 200 mg via INTRAVENOUS

## 2017-10-30 MED ORDER — FENTANYL CITRATE (PF) 100 MCG/2ML IJ SOLN
100.0000 ug | Freq: Once | INTRAMUSCULAR | Status: AC
  Administered 2017-10-30: 100 ug via INTRAVENOUS

## 2017-10-30 MED ORDER — HYDROMORPHONE HCL 1 MG/ML IJ SOLN
INTRAMUSCULAR | Status: AC
  Filled 2017-10-30: qty 1

## 2017-10-30 MED ORDER — ACETAMINOPHEN 650 MG RE SUPP: 650.0000 mg | RECTAL | Status: DC | PRN

## 2017-10-30 MED ORDER — SODIUM CHLORIDE 0.9% FLUSH: 3.0000 mL | INTRAVENOUS | Status: DC | PRN

## 2017-10-30 MED ORDER — DEXTROSE 50 % IV SOLN
INTRAVENOUS | Status: AC
  Filled 2017-10-30: qty 50

## 2017-10-30 MED ORDER — FENTANYL CITRATE (PF) 100 MCG/2ML IJ SOLN
INTRAMUSCULAR | Status: AC
  Filled 2017-10-30: qty 2

## 2017-10-30 MED ORDER — BUPIVACAINE-EPINEPHRINE (PF) 0.25% -1:200000 IJ SOLN
INTRAMUSCULAR | Status: AC
  Filled 2017-10-30: qty 30

## 2017-10-30 MED ORDER — GABAPENTIN 100 MG PO CAPS
100.0000 mg | ORAL_CAPSULE | ORAL | Status: AC
  Administered 2017-10-30: 100 mg via ORAL
  Filled 2017-10-30: qty 1

## 2017-10-30 MED ORDER — HYDROMORPHONE HCL 1 MG/ML IJ SOLN
0.2500 mg | INTRAMUSCULAR | Status: DC | PRN
  Administered 2017-10-30 (×2): 0.5 mg via INTRAVENOUS

## 2017-10-30 MED ORDER — ROCURONIUM BROMIDE 10 MG/ML (PF) SYRINGE
PREFILLED_SYRINGE | INTRAVENOUS | Status: DC | PRN
  Administered 2017-10-30: 50 mg via INTRAVENOUS

## 2017-10-30 MED ORDER — MIDAZOLAM HCL 2 MG/2ML IJ SOLN
INTRAMUSCULAR | Status: AC
  Filled 2017-10-30: qty 2

## 2017-10-30 MED ORDER — SODIUM CHLORIDE 0.9% FLUSH: 3.0000 mL | Freq: Two times a day (BID) | INTRAVENOUS | Status: DC

## 2017-10-30 MED ORDER — DEXAMETHASONE SODIUM PHOSPHATE 10 MG/ML IJ SOLN
INTRAMUSCULAR | Status: DC | PRN
  Administered 2017-10-30: 7 mg via INTRAVENOUS

## 2017-10-30 MED ORDER — HEPARIN SODIUM (PORCINE) 5000 UNIT/ML IJ SOLN
5000.0000 [IU] | INTRAMUSCULAR | Status: AC
  Administered 2017-10-30: 5000 [IU] via SUBCUTANEOUS
  Filled 2017-10-30: qty 1

## 2017-10-30 MED ORDER — FENTANYL CITRATE (PF) 250 MCG/5ML IJ SOLN
INTRAMUSCULAR | Status: AC
  Filled 2017-10-30: qty 5

## 2017-10-30 MED ORDER — SUGAMMADEX SODIUM 200 MG/2ML IV SOLN
INTRAVENOUS | Status: DC | PRN
  Administered 2017-10-30: 150 mg via INTRAVENOUS

## 2017-10-30 MED ORDER — OXYCODONE HCL 5 MG PO TABS: 5.0000 mg | ORAL_TABLET | ORAL | Status: DC | PRN

## 2017-10-30 MED ORDER — ACETAMINOPHEN 500 MG PO TABS: 1000.0000 mg | ORAL_TABLET | Freq: Four times a day (QID) | ORAL | Status: DC

## 2017-10-30 MED ORDER — 0.9 % SODIUM CHLORIDE (POUR BTL) OPTIME
TOPICAL | Status: DC | PRN
  Administered 2017-10-30: 1000 mL

## 2017-10-30 MED ORDER — SODIUM CHLORIDE 0.9 % IV SOLN: 250.0000 mL | INTRAVENOUS | Status: DC | PRN

## 2017-10-30 MED ORDER — DEXTROSE 50 % IV SOLN
25.0000 mL | Freq: Once | INTRAVENOUS | Status: AC
  Administered 2017-10-30: 25 mL via INTRAVENOUS

## 2017-10-30 MED ORDER — SODIUM CHLORIDE 0.9 % IV SOLN: INTRAVENOUS | Status: DC

## 2017-10-30 MED ORDER — FENTANYL CITRATE (PF) 100 MCG/2ML IJ SOLN: INTRAMUSCULAR | Status: DC | PRN

## 2017-10-30 MED ORDER — ENSURE PRE-SURGERY PO LIQD
296.0000 mL | Freq: Once | ORAL | Status: DC
  Filled 2017-10-30: qty 296

## 2017-10-30 MED ORDER — SODIUM CHLORIDE 0.9 % IR SOLN
Status: DC | PRN
  Administered 2017-10-30: 1

## 2017-10-30 MED ORDER — ONDANSETRON HCL 4 MG/2ML IJ SOLN
INTRAMUSCULAR | Status: DC | PRN
  Administered 2017-10-30: 4 mg via INTRAVENOUS

## 2017-10-30 MED ORDER — FENTANYL CITRATE (PF) 100 MCG/2ML IJ SOLN
INTRAMUSCULAR | Status: DC | PRN
  Administered 2017-10-30 (×2): 25 ug via INTRAVENOUS
  Administered 2017-10-30: 150 ug via INTRAVENOUS

## 2017-10-30 MED ORDER — OXYCODONE HCL 5 MG PO TABS: 5.0000 mg | ORAL_TABLET | Freq: Four times a day (QID) | ORAL | 0 refills | Status: AC | PRN

## 2017-10-30 MED ORDER — MIDAZOLAM HCL 2 MG/2ML IJ SOLN
4.0000 mg | Freq: Once | INTRAMUSCULAR | Status: AC
  Administered 2017-10-30: 4 mg via INTRAVENOUS

## 2017-10-30 MED ORDER — ACETAMINOPHEN 325 MG PO TABS: 650.0000 mg | ORAL_TABLET | ORAL | Status: DC | PRN

## 2017-10-30 SURGICAL SUPPLY — 45 items
ADH SKN CLS APL DERMABOND .7 (GAUZE/BANDAGES/DRESSINGS) ×1
APPLIER CLIP ROT 10 11.4 M/L (STAPLE)
APR CLP MED LRG 11.4X10 (STAPLE)
BAG SPEC RTRVL 10 TROC 200 (ENDOMECHANICALS) ×1
CANISTER SUCT 3000ML PPV (MISCELLANEOUS) ×2 IMPLANT
CHLORAPREP W/TINT 26ML (MISCELLANEOUS) ×2 IMPLANT
CLIP APPLIE ROT 10 11.4 M/L (STAPLE) IMPLANT
COVER SURGICAL LIGHT HANDLE (MISCELLANEOUS) ×2 IMPLANT
CUTTER FLEX LINEAR 45M (STAPLE) ×2 IMPLANT
DERMABOND ADVANCED (GAUZE/BANDAGES/DRESSINGS) ×1
DERMABOND ADVANCED .7 DNX12 (GAUZE/BANDAGES/DRESSINGS) ×1 IMPLANT
ELECT REM PT RETURN 9FT ADLT (ELECTROSURGICAL) ×2
ELECTRODE REM PT RTRN 9FT ADLT (ELECTROSURGICAL) ×1 IMPLANT
GLOVE BIO SURGEON STRL SZ7 (GLOVE) ×2 IMPLANT
GLOVE BIOGEL PI IND STRL 7.5 (GLOVE) ×1 IMPLANT
GLOVE BIOGEL PI INDICATOR 7.5 (GLOVE) ×1
GOWN STRL REUS W/ TWL LRG LVL3 (GOWN DISPOSABLE) ×3 IMPLANT
GOWN STRL REUS W/TWL LRG LVL3 (GOWN DISPOSABLE) ×6
GRASPER SUT TROCAR 14GX15 (MISCELLANEOUS) ×2 IMPLANT
KIT BASIN OR (CUSTOM PROCEDURE TRAY) ×2 IMPLANT
KIT TURNOVER KIT B (KITS) ×2 IMPLANT
NS IRRIG 1000ML POUR BTL (IV SOLUTION) ×2 IMPLANT
PAD ARMBOARD 7.5X6 YLW CONV (MISCELLANEOUS) ×4 IMPLANT
POUCH RETRIEVAL ECOSAC 10 (ENDOMECHANICALS) ×1 IMPLANT
POUCH RETRIEVAL ECOSAC 10MM (ENDOMECHANICALS) ×1
RELOAD 45 VASCULAR/THIN (ENDOMECHANICALS) ×2 IMPLANT
RELOAD STAPLE 45 2.5 WHT GRN (ENDOMECHANICALS) ×1 IMPLANT
RELOAD STAPLE 45 3.5 BLU ETS (ENDOMECHANICALS) IMPLANT
RELOAD STAPLE TA45 3.5 REG BLU (ENDOMECHANICALS) IMPLANT
SCISSORS LAP 5X35 DISP (ENDOMECHANICALS) ×2 IMPLANT
SET IRRIG TUBING LAPAROSCOPIC (IRRIGATION / IRRIGATOR) ×2 IMPLANT
SHEARS HARMONIC ACE PLUS 36CM (ENDOMECHANICALS) ×2 IMPLANT
SLEEVE ENDOPATH XCEL 5M (ENDOMECHANICALS) ×2 IMPLANT
SPECIMEN JAR SMALL (MISCELLANEOUS) ×2 IMPLANT
STRIP CLOSURE SKIN 1/2X4 (GAUZE/BANDAGES/DRESSINGS) ×2 IMPLANT
SUT MNCRL AB 4-0 PS2 18 (SUTURE) ×2 IMPLANT
SUT VICRYL 0 UR6 27IN ABS (SUTURE) ×2 IMPLANT
TOWEL OR 17X24 6PK STRL BLUE (TOWEL DISPOSABLE) ×2 IMPLANT
TOWEL OR 17X26 10 PK STRL BLUE (TOWEL DISPOSABLE) ×2 IMPLANT
TRAY FOLEY CATH SILVER 16FR (SET/KITS/TRAYS/PACK) ×2 IMPLANT
TRAY LAPAROSCOPIC MC (CUSTOM PROCEDURE TRAY) ×2 IMPLANT
TROCAR XCEL BLUNT TIP 100MML (ENDOMECHANICALS) ×2 IMPLANT
TROCAR XCEL NON-BLD 5MMX100MML (ENDOMECHANICALS) ×2 IMPLANT
TUBING INSUFFLATION (TUBING) ×2 IMPLANT
WATER STERILE IRR 1000ML POUR (IV SOLUTION) ×2 IMPLANT

## 2017-10-30 NOTE — Progress Notes (Signed)
Pt arrived to PACU with a elevation on lower left incision site with scant bloody drainage. 15 minutes later, incision elevated more. Dr. Dwain SarnaWakefield made aware. Pressure dressing placed per MD and will continue to monitor.

## 2017-10-30 NOTE — Transfer of Care (Signed)
Immediate Anesthesia Transfer of Care Note  Patient: Keith Vaughn  Procedure(s) Performed: APPENDECTOMY LAPAROSCOPIC (N/A Abdomen)  Patient Location: PACU  Anesthesia Type:GA combined with regional for post-op pain  Level of Consciousness: drowsy  Airway & Oxygen Therapy: Patient Spontanous Breathing and Patient connected to face mask oxygen  Post-op Assessment: Report given to RN and Post -op Vital signs reviewed and stable  Post vital signs: Reviewed and stable  Last Vitals:  Vitals Value Taken Time  BP 185/93 10/30/2017 10:25 AM  Temp 36.2 C 10/30/2017 10:25 AM  Pulse 50 10/30/2017 10:27 AM  Resp 14 10/30/2017 10:27 AM  SpO2 100 % 10/30/2017 10:27 AM  Vitals shown include unvalidated device data.  Last Pain:  Vitals:   10/30/17 1025  TempSrc:   PainSc: Asleep         Complications: No apparent anesthesia complications

## 2017-10-30 NOTE — H&P (Signed)
  57 year old male who presented with appendicitis. He was hospitalized for acute appendicitis with possible perforation and abscess from 08/27/17 to 08/30/17. He was treated conservatively with IV antibiotics. He followed up once in the office afterwards and the decision was made to obtain a repeat CT scan, which was scheduled for next week. Howeverhe was seen in the ER for "a funny feeling" in his right lower quadrant, not necessarily because the pain had increased. A CT scan was obtained which showed the following results:  IMPRESSION: Evaluation limited by motion, lack of fat planes and streak artifact (particularly in the pelvis secondary to hip replacements).  Taking these limitations into account, there has been interval clearing of abnormal fluid collection at the level of the appendix. There remains inflammation/thickening of the proximal appendix however the distal appendix has a normal appearance.  Circumferential thickening of the sigmoid colon which contains multiple diverticula. This circumferential thickening may be related to under distension and muscular hypertrophy however cannot exclude changes of colitis.  Limited evaluation of gallbladder secondary to motion. he reports he is fine right now and has no issues.    Past Surgical History Rolm Bookbinder, MD; 10/11/2017 2:20 PM) Hip Surgery  Bilateral.  Diagnostic Studies History Rolm Bookbinder, MD; 10/11/2017 2:20 PM) Colonoscopy  5-10 years ago  Allergies Sabino Gasser; 10/11/2017 11:47 AM) No Known Drug Allergies [09/13/2017]: Allergies Reconciled   Medication History Rolm Bookbinder, MD; 10/11/2017 2:20 PM) MetFORMIN HCl ('500MG'$  Tablet, Oral) Active. Medications Reconciled Hydrocodone-Acetaminophen (5-'325MG'$  Tablet, Oral) Active. OxyCODONE HCl ('5MG'$  Tablet, Oral) Active. Accu-Chek Aviva Plus (w/Device Kit,) Active. Accu-Chek Aviva Plus (In Vitro) Active. Accu-Chek Softclix Lancets  Active. Aspirin ('81MG'$  Tablet Chewable, Oral) Active.  Social History Rolm Bookbinder, MD; 10/11/2017 2:20 PM) Alcohol use  Remotely quit alcohol use. Caffeine use  Tea. Illicit drug use  Remotely quit drug use. Tobacco use  Current every day smoker.  Family History Rolm Bookbinder, MD; 10/11/2017 2:20 PM) Breast Cancer  Mother. Diabetes Mellitus  Father. Malignant Neoplasm Of Pancreas  Father. Respiratory Condition  Mother.  Vitals Sabino Gasser; 10/11/2017 11:48 AM) 10/11/2017 11:48 AM Weight: 159.38 lb Height: 68in Body Surface Area: 1.86 m Body Mass Index: 24.23 kg/m  Temp.: 98.33F(Oral)  Pulse: 84 (Regular)  BP: 134/82 (Sitting, Left Arm, Standard) Physical Exam Rolm Bookbinder MD; 10/11/2017 2:21 PM) General Mental Status-Alert. Orientation-Oriented X3. Chest and Lung Exam Chest and lung exam reveals -quiet, even and easy respiratory effort with no use of accessory muscles. Cardiovascular Cardiovascular examination reveals -normal heart sounds, regular rate and rhythm with no murmurs. Abdomen Note: soft tender to palpation in rlq, no mass, bs present   Assessment & Plan Rolm Bookbinder MD; 10/11/2017 2:23 PM) APPENDICITIS (K37) Story: I think due to symptoms and abnl ct scan he would do better with lap appendectomy. we discussed lap appy with recovery. we discussed risks including bleeding, open surgery, possible mass, possible ileocecectoy, infection. we will proceed soon

## 2017-10-30 NOTE — Discharge Instructions (Signed)
[facility] LAPAROSCOPIC SURGERY: POST OP INSTRUCTIONS Take ibuprofen 400 mg every 8 hours or tylenol 650 mg every six hours for next 72 hours then as needed. Use ice daily Always review your discharge instruction sheet given to you by the facility where your surgery was performed. IF YOU HAVE DISABILITY OR FAMILY LEAVE FORMS, YOU MUST BRING THEM TO THE OFFICE FOR PROCESSING.   DO NOT GIVE THEM TO YOUR DOCTOR.  1. A prescription for pain medication may be given to you upon discharge.  Take your pain medication as prescribed, if needed.  If narcotic pain medicine is not needed, then you may take acetaminophen (Tylenol), naprosyn (Alleve), or ibuprofen (Advil) as needed. 2. Take your usually prescribed medications unless otherwise directed. 3. If you need a refill on your pain medication, please contact your pharmacy.  They will contact our office to request authorization. Prescriptions will not be filled after 5pm or on week-ends. 4. You should follow a light diet the first few days after arrival home, such as soup and crackers, etc.  Be sure to include lots of fluids daily. 5. Most patients will experience some swelling and bruising in the area of the incisions.  Ice packs will help.  Swelling and bruising can take several days to resolve.  6. It is common to experience some constipation if taking pain medication after surgery.  Increasing fluid intake and taking a stool softener (such as Colace) will usually help or prevent this problem from occurring.  A mild laxative (Milk of Magnesia or Miralax) should be taken according to package instructions if there are no bowel movements after 48 hours. 7. Unless discharge instructions indicate otherwise, you may remove your bandages 48 hours after surgery, and you may shower at that time.  You may have steri-strips (small skin tapes) in place directly over the incision.  These strips should be left on the  skin for 7-10 days.  If your surgeon used skin glue on the incision, you may shower in 24 hours.  The glue will flake off over the next 2-3 weeks.  Any sutures or staples will be removed at the office during your follow-up visit. 8. ACTIVITIES:  You may resume regular (light) daily activities beginning the next day--such as daily self-care, walking, climbing stairs--gradually increasing activities as tolerated.  You may have sexual intercourse when it is comfortable.  Refrain from any heavy lifting or straining until approved by your doctor. a. You may drive when you are no longer taking prescription pain medication, you can comfortably wear a seatbelt, and you can safely maneuver your car and apply brakes. b. RETURN TO WORK:  __________________________________________________________ 9. You should see your doctor in the office for a follow-up appointment approximately 2-3 weeks after your surgery.  Make sure that you call for this appointment within a day or two after you arrive home to insure a convenient appointment time. 10. OTHER INSTRUCTIONS: __________________________________________________________________________________________________________________________ __________________________________________________________________________________________________________________________ WHEN TO CALL YOUR DOCTOR: 1. Fever over 101.0 2. Inability to urinate 3. Continued bleeding from incision. 4. Increased pain, redness, or drainage from the incision. 5. Increasing abdominal pain  The clinic staff is available to answer your questions during regular business hours.  Please dont hesitate to call and ask to speak to one of the nurses for clinical concerns.  If you have a medical emergency, go to the nearest emergency room or call 911.  [facility] is always on call at the hospital. [address]  [address], [address], [place], [place]  [address] ? [address] [address], [place], [place]    [address] [phone] ? [phone] ? FAX [phone] Web site: [email or web address]

## 2017-10-30 NOTE — Anesthesia Preprocedure Evaluation (Addendum)
Anesthesia Evaluation  Patient identified by MRN, date of birth, ID band Patient awake    Reviewed: Allergy & Precautions, NPO status , Patient's Chart, lab work & pertinent test results  Airway Mallampati: II  TM Distance: >3 FB Neck ROM: Full    Dental  (+) Missing, Dental Advisory Given,    Pulmonary Current Smoker,    breath sounds clear to auscultation       Cardiovascular negative cardio ROS   Rhythm:Regular Rate:Normal     Neuro/Psych negative neurological ROS  negative psych ROS   GI/Hepatic negative GI ROS, Neg liver ROS,   Endo/Other  diabetes, Type 2  Renal/GU negative Renal ROS  negative genitourinary   Musculoskeletal  (+) Arthritis , Osteoarthritis,    Abdominal   Peds  Hematology negative hematology ROS (+)   Anesthesia Other Findings   Reproductive/Obstetrics                            Anesthesia Physical Anesthesia Plan  ASA: III  Anesthesia Plan: General and Regional   Post-op Pain Management:  Regional for Post-op pain   Induction: Intravenous  PONV Risk Score and Plan: 1 and Dexamethasone, Ondansetron and Midazolam  Airway Management Planned: Oral ETT and Simple Face Mask  Additional Equipment:   Intra-op Plan:   Post-operative Plan: Extubation in OR  Informed Consent: I have reviewed the patients History and Physical, chart, labs and discussed the procedure including the risks, benefits and alternatives for the proposed anesthesia with the patient or authorized representative who has indicated his/her understanding and acceptance.   Dental advisory given  Plan Discussed with: CRNA  Anesthesia Plan Comments:        Anesthesia Quick Evaluation

## 2017-10-30 NOTE — Anesthesia Procedure Notes (Signed)
Anesthesia Regional Block: TAP block   Pre-Anesthetic Checklist: ,, timeout performed, Correct Patient, Correct Site, Correct Laterality, Correct Procedure, Correct Position, site marked, Risks and benefits discussed,  Surgical consent,  Pre-op evaluation,  At surgeon's request and post-op pain management  Laterality: Left  Prep: Maximum Sterile Barrier Precautions used, chloraprep       Needles:  Injection technique: Single-shot  Needle Type: Echogenic Stimulator Needle     Needle Length: 9cm  Needle Gauge: 22     Additional Needles:   Procedures:,,,, ultrasound used (permanent image in chart),,,,  Narrative:  Start time: 10/30/2017 8:37 AM End time: 10/30/2017 8:47 AM Injection made incrementally with aspirations every 5 mL.  Performed by: Personally  Anesthesiologist: Elmer PickerWoodrum, Kathryn Grosser L, MD  Additional Notes: Monitors applied. No increased pain on injection. No increased resistance to injection. Injection made in 5cc increments. Good needle visualization. Patient tolerated procedure well.

## 2017-10-30 NOTE — Anesthesia Postprocedure Evaluation (Signed)
Anesthesia Post Note  Patient: Keith Vaughn  Procedure(s) Performed: APPENDECTOMY LAPAROSCOPIC (N/A Abdomen)     Patient location during evaluation: PACU Anesthesia Type: General and Regional Level of consciousness: awake and alert Pain management: pain level controlled Vital Signs Assessment: post-procedure vital signs reviewed and stable Respiratory status: spontaneous breathing, nonlabored ventilation, respiratory function stable and patient connected to nasal cannula oxygen Cardiovascular status: blood pressure returned to baseline and stable Postop Assessment: no apparent nausea or vomiting Anesthetic complications: no    Last Vitals:  Vitals:   10/30/17 1125 10/30/17 1150  BP: (!) 149/85 (!) 166/84  Pulse: (!) 46 (!) 46  Resp: 12 14  Temp: (!) 36.4 C   SpO2: 98% 100%    Last Pain:  Vitals:   10/30/17 1150  TempSrc:   PainSc: 0-No pain                 Chelsey L Woodrum

## 2017-10-30 NOTE — Anesthesia Procedure Notes (Signed)
Anesthesia Regional Block: TAP block   Pre-Anesthetic Checklist: ,, timeout performed, Correct Patient, Correct Site, Correct Laterality, Correct Procedure, Correct Position, site marked, Risks and benefits discussed,  Surgical consent,  Pre-op evaluation,  At surgeon's request and post-op pain management  Laterality: Right  Prep: Maximum Sterile Barrier Precautions used, chloraprep       Needles:  Injection technique: Single-shot  Needle Type: Echogenic Stimulator Needle     Needle Length: 9cm  Needle Gauge: 22     Additional Needles:   Procedures:,,,, ultrasound used (permanent image in chart),,,,  Narrative:  Start time: 10/30/2017 8:48 AM End time: 10/30/2017 8:55 AM Injection made incrementally with aspirations every 5 mL.  Performed by: Personally  Anesthesiologist: Elmer PickerWoodrum, Rosela Supak L, MD  Additional Notes: Monitors applied. No increased pain on injection. No increased resistance to injection. Injection made in 5cc increments. Good needle visualization. Patient tolerated procedure well.

## 2017-10-30 NOTE — Op Note (Signed)
Preoperative diagnosis: Prior perforated appendicitis with continued abdominal pain Postoperative diagnosis: Same as above Procedure: Laparoscopic appendectomy Surgeon: Dr. Harden MoMatt Biddie Sebek Anesthesia: General with tap block Estimated blood loss: Minimal Complications: None Drains: None Specimens: Appendix to pathology Special count was correct at completion Disposition to recovery in stable condition  Indications: This is a 57 year old male who in June presented with right lower quadrant pain and a CT scan that did not show his appendix and had a 2.5 x 3.3 cm complex fluid collection.  This appeared to be an abscess secondary to appendicitis.  He got better on antibiotics and was discharged home.  He subsequently continues to have right lower quadrant pain.  A repeat CT scan that was done in July shows clearing of the abnormal fluid collection but there is inflammation and thickening of the proximal appendix and this is where he still has pain.  I discussed with him the options and due to continued pain we elected to proceed with laparoscopic appendectomy.  Procedure: After informed consent was obtained the patient was taken to the operating room.  He was given antibiotics.  He had SCDs in place.  He was placed under general anesthesia without complication.  He was prepped and draped in the standard sterile surgical fashion.  A surgical timeout was then performed.    I infiltrated Marcaine below the umbilicus.  I made a vertical incision.  I identified the fascia incised sharply.  The peritoneum was entered bluntly.  I placed a 0 Vicryl pursestring suture through the fascia.  I inserted a Hassan trocar and insufflated the abdomen to 15 mmHg pressure.  I then inserted 2 further 5 mm trocars in the suprapubic region and left lower quadrant under direct vision without complication.  I then proceeded to explore the abdomen.  There were no real abnormalities found.  I then was able to identify the cecum.   The appendix was still present in its entirety.  The distal two thirds look normal.  The proximal one third was very thickened and inflamed.  I used a combination of blunt dissection as well as a harmonic scalpel to remove it from both the sidewall is where as well as where it was attached to the terminal ileum.  I then identified the base.  I inserted a GIA stapler and removed the appendix as well as a small cuff of the cecum to ensure I removed it in its entirety.  I then placed this in a bag and removed it from the abdomen.  I then explored the abdomen again.  The staple line was clean.  I did explore several a couple of feet of the terminal ileum and this was all normal as well as the cecum.  I then proceeded to [facility] the [facility].  I tied my pursestring down.  I placed an additional 2-0 Vicryl sutures through this using the suture passer.  I then remove the trocars and desufflated the abdomen.  These were closed with 4-0 Vicryl and glue.  He tolerated this well was extubated and transferred to recovery in stable condition.

## 2017-10-30 NOTE — Interval H&P Note (Signed)
History and Physical Interval Note:  10/30/2017 8:47 AM  Keith Vaughn  has presented today for surgery, with the diagnosis of appendicitis  The various methods of treatment have been discussed with the patient and family. After consideration of risks, benefits and other options for treatment, the patient has consented to  Procedure(s): APPENDECTOMY LAPAROSCOPIC ERAS PATHWAY (N/A) as a surgical intervention .  The patient's history has been reviewed, patient examined, no change in status, stable for surgery.  I have reviewed the patient's chart and labs.  Questions were answered to the patient's satisfaction.     Emelia LoronMatthew Ngoc Detjen

## 2017-10-30 NOTE — Anesthesia Procedure Notes (Signed)
Procedure Name: Intubation Date/Time: 10/30/2017 9:29 AM Performed by: Ponciano OrtBrewer, Halil Rentz, CRNA Pre-anesthesia Checklist: Patient identified, Emergency Drugs available, Suction available and Patient being monitored Patient Re-evaluated:Patient Re-evaluated prior to induction Oxygen Delivery Method: Circle system utilized Preoxygenation: Pre-oxygenation with 100% oxygen Induction Type: IV induction Ventilation: Mask ventilation without difficulty and Mask ventilation throughout procedure Laryngoscope Size: Glidescope and 4 Grade View: Grade I Tube type: Oral Tube size: 7.5 mm Number of attempts: 1 Airway Equipment and Method: Stylet and Video-laryngoscopy Placement Confirmation: ETT inserted through vocal cords under direct vision,  positive ETCO2 and breath sounds checked- equal and bilateral Secured at: 24 cm Tube secured with: Tape Dental Injury: Teeth and Oropharynx as per pre-operative assessment

## 2017-10-31 ENCOUNTER — Encounter (HOSPITAL_COMMUNITY): Payer: Self-pay | Admitting: General Surgery

## 2018-02-20 ENCOUNTER — Ambulatory Visit (INDEPENDENT_AMBULATORY_CARE_PROVIDER_SITE_OTHER): Payer: Medicaid Other

## 2018-02-20 ENCOUNTER — Encounter (INDEPENDENT_AMBULATORY_CARE_PROVIDER_SITE_OTHER): Payer: Self-pay | Admitting: Orthopaedic Surgery

## 2018-02-20 ENCOUNTER — Ambulatory Visit (INDEPENDENT_AMBULATORY_CARE_PROVIDER_SITE_OTHER): Payer: Medicaid Other | Admitting: Orthopaedic Surgery

## 2018-02-20 DIAGNOSIS — Z96641 Presence of right artificial hip joint: Secondary | ICD-10-CM | POA: Diagnosis not present

## 2018-02-20 DIAGNOSIS — Z96642 Presence of left artificial hip joint: Secondary | ICD-10-CM | POA: Diagnosis not present

## 2018-02-20 NOTE — Progress Notes (Signed)
Office Visit Note   Patient: Keith Vaughn           Date of Birth: 24-Dec-1960           MRN: [id number] Visit Date: 02/20/2018              Requested by: Fleet ContrasAvbuere, Edwin, MD [address] [place], [place] [id number] PCP: Fleet ContrasAvbuere, Edwin, MD   Assessment & Plan: Visit Diagnoses:  1. History of right hip replacement   2. Status post total replacement of left hip     Plan: He will continue work on range of motion strengthening both hips.  Follow-up with us on as-needed basis.  Questions encouraged and answered.  Follow-Up Instructions: Return if symptoms worsen or fail to improve.   Orders:  Orders Placed This Encounter  Procedures  . XR Pelvis 1-2 Views   No orders of the defined types were placed in this encounter.     Procedures: No procedures performed   Clinical Data: No additional findings.   Subjective: Chief Complaint  Patient presents with  . Right Hip - Follow-up    HPI Mr. Willette BraceBoger returns today now 7 months status post right total hip arthroplasty in 27 months status post left total hip arthroplasty.  He is back to work.  He is working out squatting some 290 pounds.  States he is having no pain in either hip.  Still some decreased range of motion of the right hip.  States that his right hip incision is healed well.  He has no complaints. Review of Systems See HPI otherwise negative  Objective: Vital Signs: There were no vitals taken for this visit.  Physical Exam Constitutional:      General: He is not in acute distress.    Appearance: He is normal weight. He is not ill-appearing, toxic-appearing or diaphoretic.  Neurological:     Mental Status: He is alert and oriented to person, place, and time.  Psychiatric:        Mood and Affect: Mood normal.     Ortho Exam Bilateral hips excellent range of motion without pain.  Has some slightly limited external rotation of the right hip full rotation external and internal to the left hip.  Calves are  supple nontender.  He ambulates with a nonantalgic gait without any assistive device. Specialty Comments:  No specialty comments available.  Imaging: Xr Pelvis 1-2 Views  Result Date: 02/20/2018 AP pelvis: Status post bilateral total hip arthroplasty without complications.  Implants appear well seated.  No acute fractures bony abnormalities otherwise.    PMFS History: Patient Active Problem List   Diagnosis Date Noted  . Acute perforated appendicitis 08/27/2017  . Unilateral primary osteoarthritis, right hip 07/10/2017  . Status post total replacement of right hip 07/10/2017  . Osteoarthritis of left hip 11/16/2015  . Status post left hip replacement 11/16/2015   Past Medical History:  Diagnosis Date  . Arthritis    "was in my hips" (08/28/2017)  . Type II diabetes mellitus (HCC)     Family History  Problem Relation Age of Onset  . COPD Mother   . Breast cancer Mother   . Pancreatic cancer Father     Past Surgical History:  Procedure Laterality Date  . CLOSED REDUCTION HAND FRACTURE Right    bolts and screws  . COLONOSCOPY    . FRACTURE SURGERY    . JOINT REPLACEMENT    . LAPAROSCOPIC APPENDECTOMY N/A 10/30/2017   Procedure: APPENDECTOMY LAPAROSCOPIC;  Surgeon: Dwain SarnaWakefield,  Molli Hazard, MD;  Location: [facility] OR;  Service: General;  Laterality: N/A;  . TONSILLECTOMY    . TOTAL HIP ARTHROPLASTY Left 11/16/2015   Procedure: LEFT TOTAL HIP ARTHROPLASTY ANTERIOR APPROACH;  Surgeon: Kathryne Hitch, MD;  Location: [facility] OR;  Service: Orthopedics;  Laterality: Left;  . TOTAL HIP ARTHROPLASTY Right 07/10/2017   Procedure: RIGHT TOTAL HIP ARTHROPLASTY ANTERIOR APPROACH;  Surgeon: Kathryne Hitch, MD;  Location: [facility] OR;  Service: Orthopedics;  Laterality: Right;   Social History   Occupational History  . Not on file  Tobacco Use  . Smoking status: Current Every Day Smoker    Packs/day: 0.50    Years: 40.00    Pack years: 20.00    Types: Cigarettes  . Smokeless tobacco:  Never Used  Substance and Sexual Activity  . Alcohol use: No    Comment: 08/28/2017 "nothing since 2011"  . Drug use: Yes    Types: "Crack" cocaine, Cocaine, Marijuana    Comment: 08/28/2017 "smoke marijuana qd; no other drugs since 2011"  . Sexual activity: Yes

## 2018-09-11 IMAGING — CT CT ABD-PELV W/ CM
2 of 6 series · 16 of 46 positions shown, 18 images · IV contrast (Omni 300)
Comparison: None.

CLINICAL DATA: Lower abdominal pain.

EXAM:
CT ABDOMEN AND PELVIS WITH CONTRAST
TECHNIQUE: Multidetector CT imaging of the abdomen and pelvis was performed
using the standard protocol following bolus administration of
intravenous contrast.
CONTRAST:  100mL OMNIPAQUE IOHEXOL 300 MG/ML  SOLN

[Series 6: a/p w/ cor · coronal · 0.74mm/px · 3 of 144 slices shown]
[im 48/144  soft-tissue]
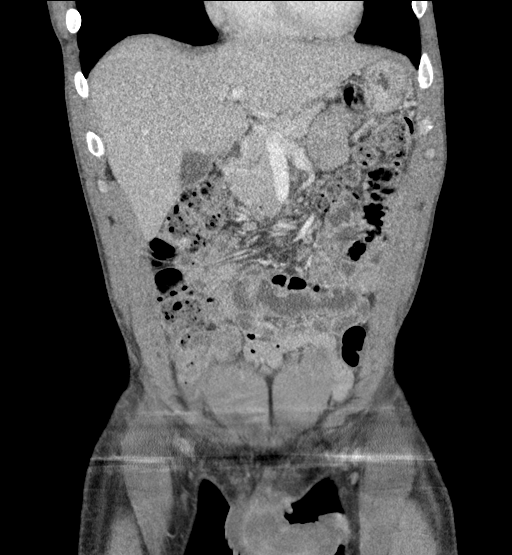
[im 64/144  soft-tissue]
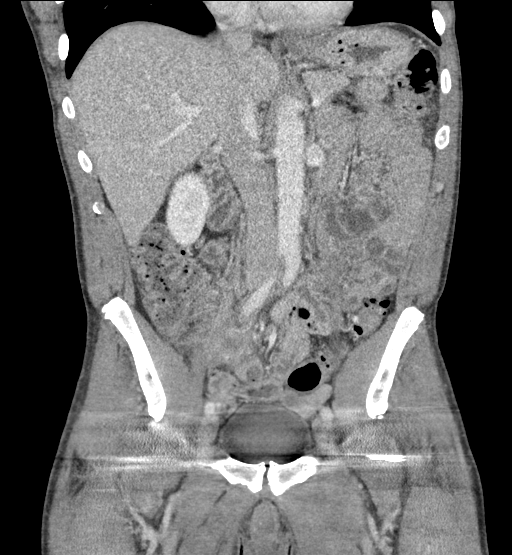
[im 80/144  soft-tissue]
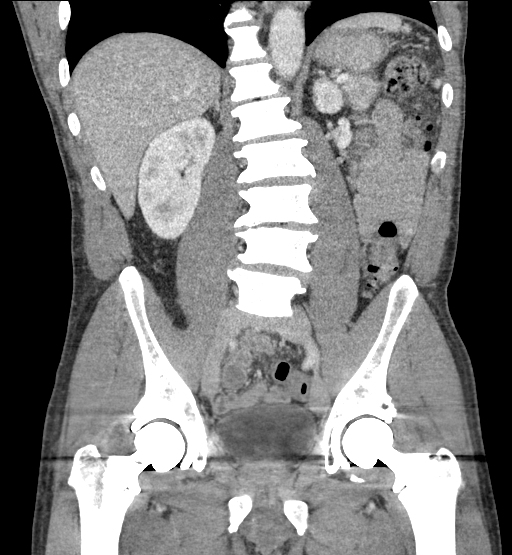

[Series 9: a/p w/ 5mm (person_name) · axial · 0.63mm/px · z∈[+937,+1292]mm · 13 of 83 slices shown, 15 images]
[im 6/83  soft-tissue]
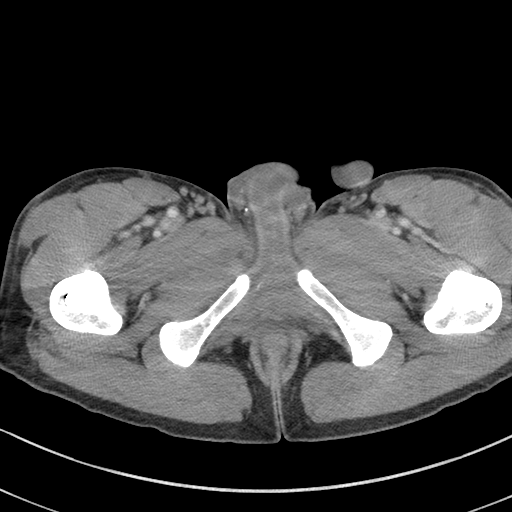
[im 6/83  bone]
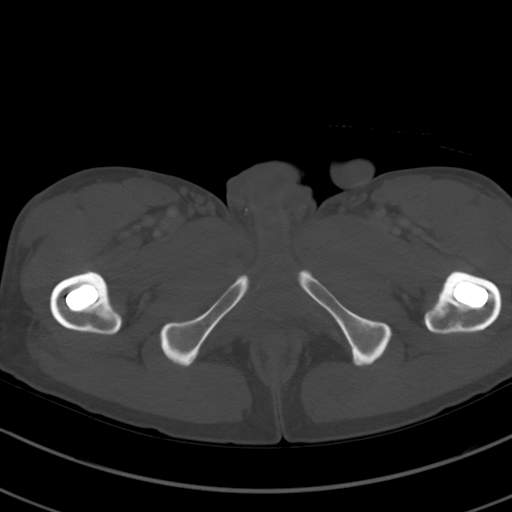
[im 11/83  soft-tissue]
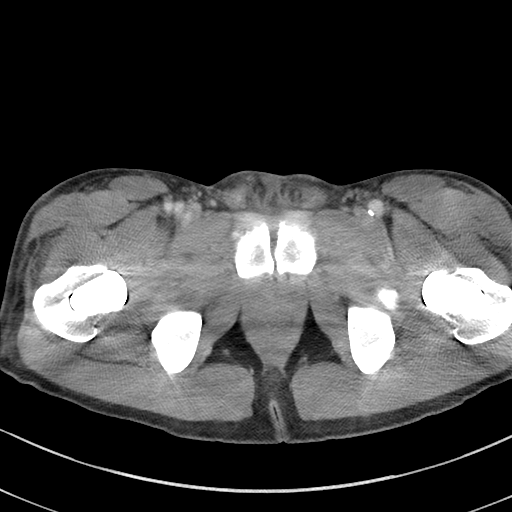
[im 16/83  soft-tissue]
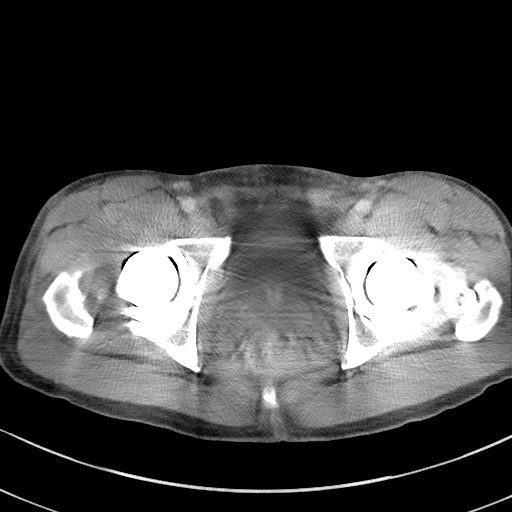
[im 26/83  soft-tissue]
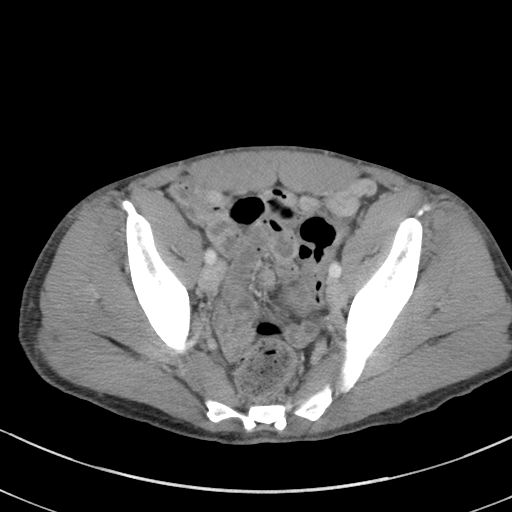
[im 31/83  soft-tissue]
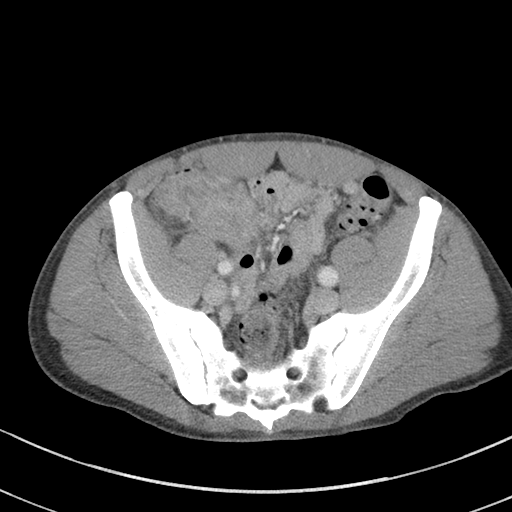
[im 36/83  soft-tissue]
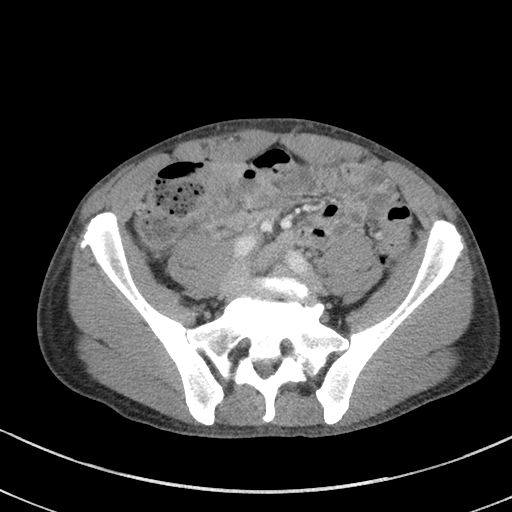
[im 42/83  soft-tissue]
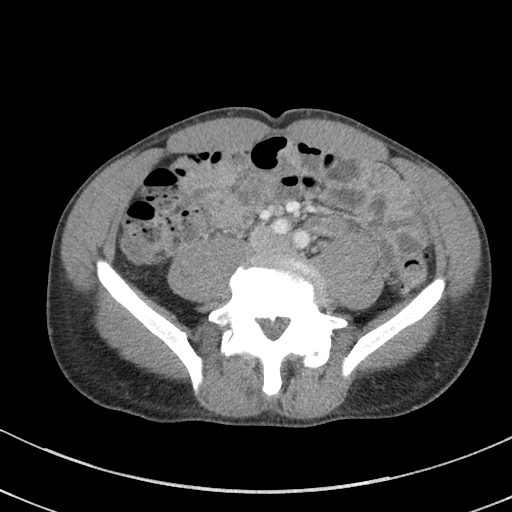
[im 47/83  soft-tissue]
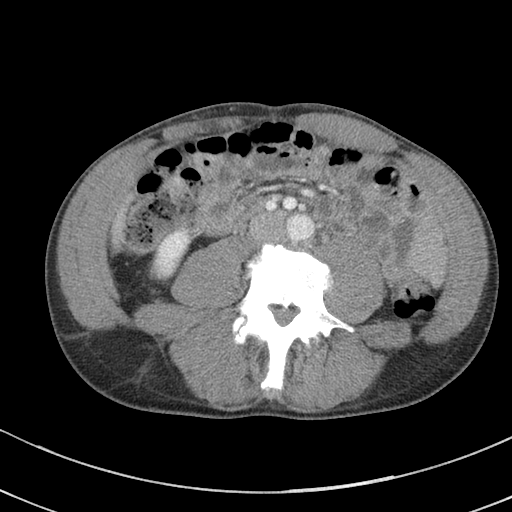
[im 52/83  soft-tissue]
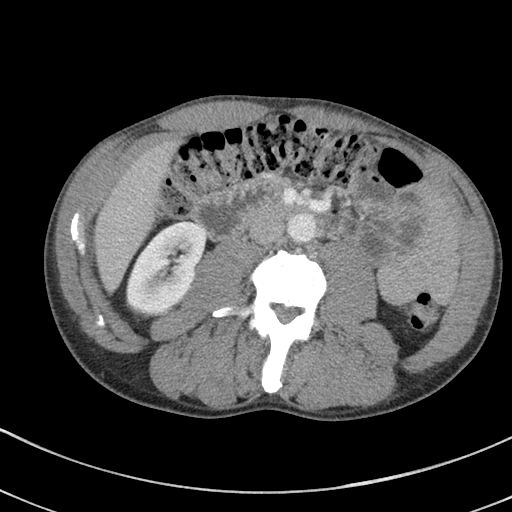
[im 52/83  bone]
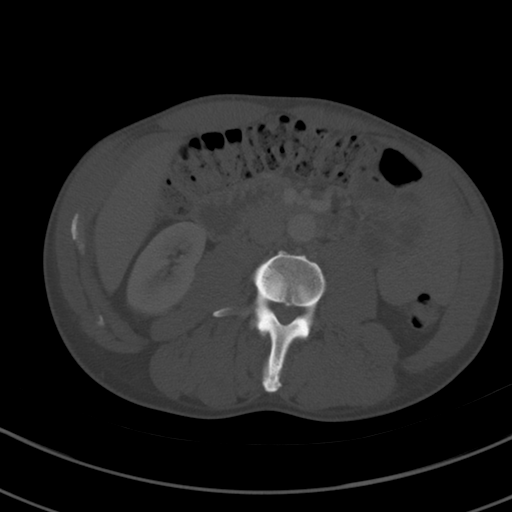
[im 57/83  soft-tissue]
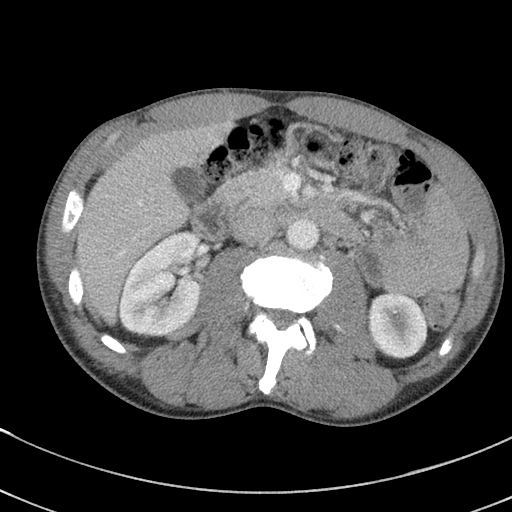
[im 67/83  soft-tissue]
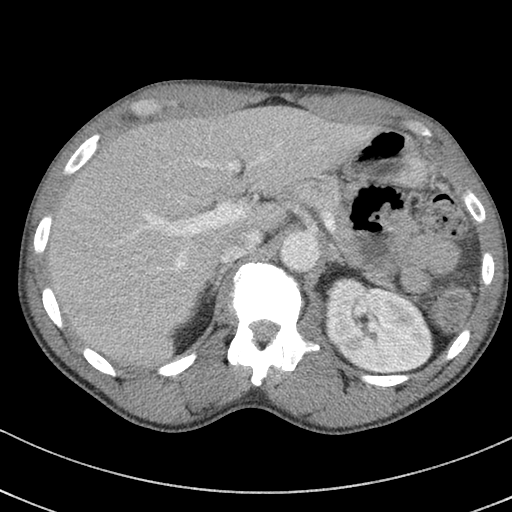
[im 72/83  soft-tissue]
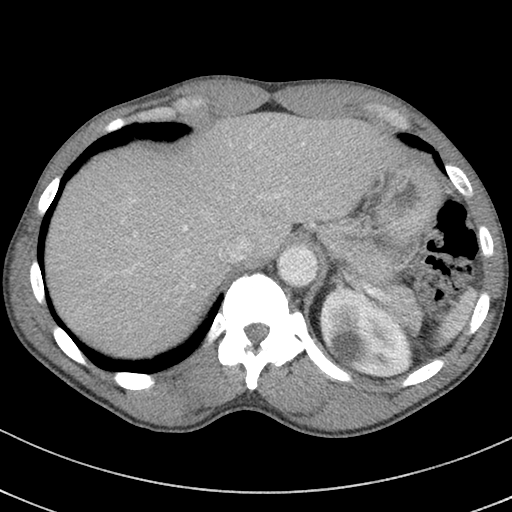
[im 77/83  soft-tissue]
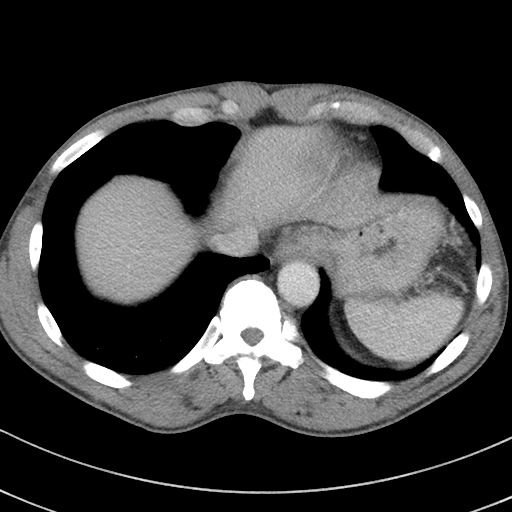

[16 of 46 positions shown; findings below may reference images not displayed]

FINDINGS: Lower chest: No acute abnormality.

Hepatobiliary: No focal liver abnormality is seen. No gallstones,
gallbladder wall thickening, or biliary dilatation.

Pancreas: Unremarkable. No pancreatic ductal dilatation or
surrounding inflammatory changes.

Spleen: Normal in size without focal abnormality.

Adrenals/Urinary Tract: Normal adrenal glands. No urolithiasis or
obstructive uropathy. 2.7 x 2.5 cm hypodense, fluid attenuating left
renal mass most consistent with a cyst. No solid renal mass. Normal
bladder.

Stomach/Bowel: Stomach is within normal limits. No evidence of bowel
wall thickening, distention, or inflammatory changes. No normal nor
abnormal appendix is identified. 2.5 x 3.3 cm complex fluid
collection in the right lower quadrant which is difficult to
distinguish from fluid-filled loops of adjacent small bowel.

Vascular/Lymphatic: No significant vascular findings are present. No
enlarged abdominal or pelvic lymph nodes.

Reproductive: Prostate is unremarkable.

Other: No abdominal wall hernia or abnormality. No abdominopelvic
ascites.

Musculoskeletal: No acute or significant osseous findings.
Degenerative disc disease disc height loss at L3-4 and L5-S1. Severe
bilateral facet arthropathy at L5-S1. Bilateral facet arthropathy
throughout the lumbar spine.
IMPRESSION: 1. No normal nor abnormal appendix is identified. 2.5 x 3.3 cm
complex fluid collection in the right lower quadrant which is
difficult to distinguish from fluid-filled loops of adjacent small
bowel. The appearance is concerning for an abscess which may be
secondary to appendicitis. The lack of oral contrast material limits
evaluation. If there is further clinical concern, recommend repeat
CT abdomen and pelvis with oral contrast.

## 2020-03-25 ENCOUNTER — Other Ambulatory Visit: Payer: Self-pay | Admitting: Internal Medicine

## 2020-03-26 LAB — CBC
HCT: 43.1 % (ref 38.5–50.0)
Hemoglobin: 14.5 g/dL (ref 13.2–17.1)
MCH: 28.7 pg (ref 27.0–33.0)
MCHC: 33.6 g/dL (ref 32.0–36.0)
MCV: 85.2 fL (ref 80.0–100.0)
MPV: 10.8 fL (ref 7.5–12.5)
Platelets: 305 10*3/uL (ref 140–400)
RBC: 5.06 10*6/uL (ref 4.20–5.80)
RDW: 13 % (ref 11.0–15.0)
WBC: 7.7 10*3/uL (ref 3.8–10.8)

## 2020-03-26 LAB — COMPLETE METABOLIC PANEL WITH GFR
AG Ratio: 1.8 (calc) (ref 1.0–2.5)
ALT: 21 U/L (ref 9–46)
AST: 30 U/L (ref 10–35)
Albumin: 4.3 g/dL (ref 3.6–5.1)
Alkaline phosphatase (APISO): 55 U/L (ref 35–144)
BUN: 17 mg/dL (ref 7–25)
CO2: 25 mmol/L (ref 20–32)
Calcium: 9.3 mg/dL (ref 8.6–10.3)
Chloride: 106 mmol/L (ref 98–110)
Creat: 1.26 mg/dL (ref 0.70–1.33)
GFR, Est African American: 72 mL/min/{1.73_m2} (ref >=60)
GFR, Est Non African American: 62 mL/min/{1.73_m2} (ref >=60)
Globulin: 2.4 g/dL (calc) (ref 1.9–3.7)
Glucose, Bld: 75 mg/dL (ref 65–99)
Potassium: 4.5 mmol/L (ref 3.5–5.3)
Sodium: 140 mmol/L (ref 135–146)
Total Bilirubin: 0.4 mg/dL (ref 0.2–1.2)
Total Protein: 6.7 g/dL (ref 6.1–8.1)

## 2020-03-26 LAB — VITAMIN D 25 HYDROXY (VIT D DEFICIENCY, FRACTURES): Vit D, 25-Hydroxy: 26 ng/mL — ABNORMAL LOW (ref 30–100)

## 2020-03-26 LAB — LIPID PANEL
Cholesterol: 127 mg/dL (ref <200)
HDL: 39 mg/dL — ABNORMAL LOW (ref >=40)
LDL Cholesterol (Calc): 72 mg/dL (calc)
Non-HDL Cholesterol (Calc): 88 mg/dL (calc) (ref <130)
Total CHOL/HDL Ratio: 3.3 (calc) (ref <5.0)
Triglycerides: 78 mg/dL (ref <150)

## 2020-03-26 LAB — PSA: PSA: 0.63 ng/mL (ref <=4.0)

## 2020-03-26 LAB — TSH: TSH: 1.41 mIU/L (ref 0.40–4.50)

## 2021-04-14 ENCOUNTER — Other Ambulatory Visit: Payer: Self-pay | Admitting: Internal Medicine

## 2021-04-15 LAB — LIPID PANEL
Cholesterol: 139 mg/dL (ref <200)
HDL: 46 mg/dL (ref >=40)
LDL Cholesterol (Calc): 79 mg/dL (calc)
Non-HDL Cholesterol (Calc): 93 mg/dL (calc) (ref <130)
Total CHOL/HDL Ratio: 3 (calc) (ref <5.0)
Triglycerides: 59 mg/dL (ref <150)

## 2021-04-15 LAB — CBC
HCT: 46.3 % (ref 38.5–50.0)
Hemoglobin: 15.1 g/dL (ref 13.2–17.1)
MCH: 28.4 pg (ref 27.0–33.0)
MCHC: 32.6 g/dL (ref 32.0–36.0)
MCV: 87 fL (ref 80.0–100.0)
MPV: 11 fL (ref 7.5–12.5)
Platelets: 207 10*3/uL (ref 140–400)
RBC: 5.32 10*6/uL (ref 4.20–5.80)
RDW: 13.3 % (ref 11.0–15.0)
WBC: 6.8 10*3/uL (ref 3.8–10.8)

## 2021-04-15 LAB — PSA: PSA: 0.55 ng/mL (ref <=4.00)

## 2021-04-15 LAB — COMPLETE METABOLIC PANEL WITH GFR
AG Ratio: 1.8 (calc) (ref 1.0–2.5)
ALT: 30 U/L (ref 9–46)
AST: 36 U/L — ABNORMAL HIGH (ref 10–35)
Albumin: 4.4 g/dL (ref 3.6–5.1)
Alkaline phosphatase (APISO): 53 U/L (ref 35–144)
BUN: 18 mg/dL (ref 7–25)
CO2: 27 mmol/L (ref 20–32)
Calcium: 9.2 mg/dL (ref 8.6–10.3)
Chloride: 105 mmol/L (ref 98–110)
Creat: 1.27 mg/dL (ref 0.70–1.35)
Globulin: 2.4 g/dL (calc) (ref 1.9–3.7)
Glucose, Bld: 86 mg/dL (ref 65–99)
Potassium: 4.3 mmol/L (ref 3.5–5.3)
Sodium: 141 mmol/L (ref 135–146)
Total Bilirubin: 0.6 mg/dL (ref 0.2–1.2)
Total Protein: 6.8 g/dL (ref 6.1–8.1)
eGFR: 65 mL/min/{1.73_m2} (ref >=60)

## 2021-04-15 LAB — TSH: TSH: 2.02 mIU/L (ref 0.40–4.50)

## 2021-04-15 LAB — VITAMIN D 25 HYDROXY (VIT D DEFICIENCY, FRACTURES): Vit D, 25-Hydroxy: 28 ng/mL — ABNORMAL LOW (ref 30–100)

## 2022-04-13 ENCOUNTER — Other Ambulatory Visit: Payer: Self-pay | Admitting: Internal Medicine

## 2022-04-14 LAB — CBC
HCT: 47.2 % (ref 38.5–50.0)
Hemoglobin: 15.8 g/dL (ref 13.2–17.1)
MCH: 29 pg (ref 27.0–33.0)
MCHC: 33.5 g/dL (ref 32.0–36.0)
MCV: 86.6 fL (ref 80.0–100.0)
MPV: 11.2 fL (ref 7.5–12.5)
Platelets: 206 10*3/uL (ref 140–400)
RBC: 5.45 10*6/uL (ref 4.20–5.80)
RDW: 13.9 % (ref 11.0–15.0)
WBC: 6.9 10*3/uL (ref 3.8–10.8)

## 2022-04-14 LAB — COMPLETE METABOLIC PANEL WITH GFR
AG Ratio: 1.8 (calc) (ref 1.0–2.5)
ALT: 26 U/L (ref 9–46)
AST: 29 U/L (ref 10–35)
Albumin: 4.4 g/dL (ref 3.6–5.1)
Alkaline phosphatase (APISO): 58 U/L (ref 35–144)
BUN: 22 mg/dL (ref 7–25)
CO2: 24 mmol/L (ref 20–32)
Calcium: 9.3 mg/dL (ref 8.6–10.3)
Chloride: 109 mmol/L (ref 98–110)
Creat: 1.32 mg/dL (ref 0.70–1.35)
Globulin: 2.5 g/dL (calc) (ref 1.9–3.7)
Glucose, Bld: 80 mg/dL (ref 65–99)
Potassium: 4.9 mmol/L (ref 3.5–5.3)
Sodium: 143 mmol/L (ref 135–146)
Total Bilirubin: 0.3 mg/dL (ref 0.2–1.2)
Total Protein: 6.9 g/dL (ref 6.1–8.1)
eGFR: 61 mL/min/{1.73_m2} (ref >=60)

## 2022-04-14 LAB — VITAMIN D 25 HYDROXY (VIT D DEFICIENCY, FRACTURES): Vit D, 25-Hydroxy: 36 ng/mL (ref 30–100)

## 2022-04-14 LAB — LIPID PANEL
Cholesterol: 139 mg/dL (ref <200)
HDL: 39 mg/dL — ABNORMAL LOW (ref >=40)
LDL Cholesterol (Calc): 77 mg/dL (calc)
Non-HDL Cholesterol (Calc): 100 mg/dL (calc) (ref <130)
Total CHOL/HDL Ratio: 3.6 (calc) (ref <5.0)
Triglycerides: 130 mg/dL (ref <150)

## 2022-04-14 LAB — PSA: PSA: 0.56 ng/mL (ref <=4.00)

## 2023-04-26 ENCOUNTER — Ambulatory Visit: Payer: Medicaid Other | Admitting: Podiatry

## 2023-11-07 ENCOUNTER — Other Ambulatory Visit (INDEPENDENT_AMBULATORY_CARE_PROVIDER_SITE_OTHER): Payer: Self-pay

## 2023-11-07 ENCOUNTER — Encounter: Payer: Self-pay | Admitting: Orthopedic Surgery

## 2023-11-07 ENCOUNTER — Ambulatory Visit (INDEPENDENT_AMBULATORY_CARE_PROVIDER_SITE_OTHER): Admitting: Orthopedic Surgery

## 2023-11-07 DIAGNOSIS — M25511 Pain in right shoulder: Secondary | ICD-10-CM

## 2023-11-07 NOTE — Progress Notes (Signed)
 Office Visit Note   Patient: Keith Vaughn           Date of Birth: 10-17-1960           MRN: [id number] Visit Date: 11/07/2023 Requested by: Shelda Atlas, MD [address] [place],  [place] [address] PCP: Shelda Atlas, MD  Subjective: Chief Complaint  Patient presents with   Right Shoulder - Pain    HPI: Keith Vaughn is a 63 y.o. male who presents to the office reporting right shoulder pain.  He has had chronic pain in the shoulder for more than 15 years but is been severe over the past 2 months.  Patient states it hurts when he pinches which is up to 225 pounds.  Has not really been able to do that type of lifting for 2 months.  He is right-hand dominant.  Patient states that the pain wakes him up from sleep starting 2 months ago.  Also reported some stiffness 2 months ago.  Does have diabetes but is does not take medication because his blood glucose has been well-controlled.  Has had bilateral total hip replacements by Dr. Vernetta.  Benchpress and push-ups hurt his shoulder.  He states that he does not really go heavy when he works out.  Pain is primarily in the anterior deltoid region.  Has a history of prior subacromial injection.  Patient states that his whole joint hurts.  Denies any neck pain or radicular pain.  In general his main goal is to get back to working out and bench pressing more without pain..                ROS: All systems reviewed are negative as they relate to the chief complaint within the history of present illness.  Patient denies fevers or chills.  Assessment & Plan: Visit Diagnoses:  1. Right shoulder pain, unspecified chronicity     Plan: Impression is right shoulder pain.  Radiographs look pretty reasonable.  Bilateral shoulder motion is slightly restricted.  May have occult arthritis or some type of biceps tendon problem.  Pain has been going on for many years but worse over the past 2 months.  Plan MRI arthrogram right shoulder to evaluate biceps  tendon.  Also for occult arthritis.  Follow-up after that study.  Follow-Up Instructions: No follow-ups on file.   Orders:  Orders Placed This Encounter  Procedures   XR Shoulder Right   No orders of the defined types were placed in this encounter.     Procedures: No procedures performed   Clinical Data: No additional findings.  Objective: Vital Signs: There were no vitals taken for this visit.  Physical Exam:  Constitutional: Patient appears well-developed HEENT:  Head: Normocephalic Eyes:EOM are normal Neck: Normal range of motion Cardiovascular: Normal rate Pulmonary/chest: Effort normal Neurologic: Patient is alert Skin: Skin is warm Psychiatric: Patient has normal mood and affect  Ortho Exam: Ortho exam demonstrates bilateral shoulder range of motion is 20/95/155.  No discrete AC joint tenderness right versus left.  Does have a little bit more prominent spurring on that right AC joint longitudinally than it does on the left-hand side.  Not particularly tender either side.  Does have mild biceps groove tenderness on the right compared to the left but he is very muscular and this could be diffuse pain.  O'Brien's testing is equivocal on the right negative on the left.  Specialty Comments:  No specialty comments available.  Imaging: XR Shoulder Right Result Date: 11/07/2023  AP outlet axillary lateral radiographs right shoulder reviewed.  No acute fracture.  Shoulder is located.  Acromiohumeral distance is normal.  No significant degenerative changes in the glenohumeral joint with mild to moderate degenerative changes in the [facility] joint with small ossicle superiorly in the [facility] joint.  Patient also has small traction spur off the inferior aspect of the glenoid..  Visualized lung fields clear     PMFS History: Patient Active Problem List   Diagnosis Date Noted   Acute perforated appendicitis 08/27/2017   Unilateral primary osteoarthritis, right hip 07/10/2017   Status  post total replacement of right hip 07/10/2017   Osteoarthritis of left hip 11/16/2015   Status post left hip replacement 11/16/2015   Past Medical History:  Diagnosis Date   Arthritis    was in my hips (08/28/2017)   Type II diabetes mellitus (HCC)     Family History  Problem Relation Age of Onset   COPD Mother    Breast cancer Mother    Pancreatic cancer Father     Past Surgical History:  Procedure Laterality Date   CLOSED REDUCTION HAND FRACTURE Right    bolts and screws   COLONOSCOPY     FRACTURE SURGERY     JOINT REPLACEMENT     LAPAROSCOPIC APPENDECTOMY N/A 10/30/2017   Procedure: APPENDECTOMY LAPAROSCOPIC;  Surgeon: Ebbie Cough, MD;  Location: [facility];  Service: General;  Laterality: N/A;   TONSILLECTOMY     TOTAL HIP ARTHROPLASTY Left 11/16/2015   Procedure: LEFT TOTAL HIP ARTHROPLASTY ANTERIOR APPROACH;  Surgeon: Lonni CINDERELLA Poli, MD;  Location: [facility] OR;  Service: Orthopedics;  Laterality: Left;   TOTAL HIP ARTHROPLASTY Right 07/10/2017   Procedure: RIGHT TOTAL HIP ARTHROPLASTY ANTERIOR APPROACH;  Surgeon: Poli Lonni CINDERELLA, MD;  Location: [facility] OR;  Service: Orthopedics;  Laterality: Right;   Social History   Occupational History   Not on file  Tobacco Use   Smoking status: Every Day    Current packs/day: 0.50    Average packs/day: 0.5 packs/day for 40.0 years (20.0 ttl pk-yrs)    Types: Cigarettes   Smokeless tobacco: Never  Vaping Use   Vaping status: Never Used  Substance and Sexual Activity   Alcohol  use: No    Comment: 08/28/2017 nothing since 2011   Drug use: Yes    Types: Crack cocaine, Cocaine, Marijuana    Comment: 08/28/2017 smoke marijuana qd; no other drugs since 2011   Sexual activity: Yes

## 2023-11-11 ENCOUNTER — Encounter: Payer: Self-pay | Admitting: Orthopedic Surgery

## 2023-11-15 ENCOUNTER — Emergency Department (HOSPITAL_COMMUNITY): Admission: EM | Admit: 2023-11-15 | Discharge: 2023-11-16 | Discharge status: Against Medical Advice

## 2023-11-15 ENCOUNTER — Encounter: Payer: Self-pay | Admitting: Orthopedic Surgery

## 2023-11-15 ENCOUNTER — Other Ambulatory Visit: Payer: Self-pay

## 2023-11-15 ENCOUNTER — Emergency Department (HOSPITAL_COMMUNITY)

## 2023-11-15 ENCOUNTER — Encounter (HOSPITAL_COMMUNITY): Payer: Self-pay

## 2023-11-15 DIAGNOSIS — R079 Chest pain, unspecified: Secondary | ICD-10-CM | POA: Insufficient documentation

## 2023-11-15 DIAGNOSIS — Y92410 Unspecified street and highway as the place of occurrence of the external cause: Secondary | ICD-10-CM | POA: Insufficient documentation

## 2023-11-15 DIAGNOSIS — M545 Low back pain, unspecified: Secondary | ICD-10-CM | POA: Diagnosis not present

## 2023-11-15 DIAGNOSIS — M25511 Pain in right shoulder: Secondary | ICD-10-CM | POA: Insufficient documentation

## 2023-11-15 DIAGNOSIS — M542 Cervicalgia: Secondary | ICD-10-CM | POA: Diagnosis not present

## 2023-11-15 DIAGNOSIS — Z5321 Procedure and treatment not carried out due to patient leaving prior to being seen by health care provider: Secondary | ICD-10-CM | POA: Diagnosis not present
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
## 2023-11-15 NOTE — ED Notes (Signed)
 Pt stepped outside, said will return to lobby.
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
## 2023-11-16 NOTE — ED Notes (Signed)
 Pt did not answer when name was called 3x.

## 2023-11-27 ENCOUNTER — Other Ambulatory Visit
